# Patient Record
Sex: Female | Born: 1995 | Race: White | Hispanic: No | Marital: Married | Smoking: Never smoker
Health system: Southern US, Community
[De-identification: ages and names within clinical notes are randomized; demographics above are authoritative.]

## PROBLEM LIST (undated history)

## (undated) DIAGNOSIS — Z789 Other specified health status: Secondary | ICD-10-CM

## (undated) DIAGNOSIS — L309 Dermatitis, unspecified: Secondary | ICD-10-CM

## (undated) DIAGNOSIS — O24419 Gestational diabetes mellitus in pregnancy, unspecified control: Secondary | ICD-10-CM

## (undated) HISTORY — DX: Dermatitis, unspecified: L30.9

## (undated) HISTORY — DX: Gestational diabetes mellitus in pregnancy, unspecified control: O24.419

## (undated) HISTORY — PX: MOUTH SURGERY: SHX715

---
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
## 2017-05-26 ENCOUNTER — Encounter: Payer: Self-pay | Admitting: Advanced Practice Midwife

## 2017-06-09 ENCOUNTER — Encounter: Payer: Self-pay | Admitting: Advanced Practice Midwife

## 2017-06-09 ENCOUNTER — Ambulatory Visit (INDEPENDENT_AMBULATORY_CARE_PROVIDER_SITE_OTHER): Payer: 59 | Admitting: Advanced Practice Midwife

## 2017-06-09 VITALS — BP 108/56 | HR 84 | Ht 62.5 in | Wt 128.0 lb

## 2017-06-09 DIAGNOSIS — N898 Other specified noninflammatory disorders of vagina: Secondary | ICD-10-CM

## 2017-06-09 DIAGNOSIS — Z01419 Encounter for gynecological examination (general) (routine) without abnormal findings: Secondary | ICD-10-CM

## 2017-06-09 DIAGNOSIS — Z113 Encounter for screening for infections with a predominantly sexual mode of transmission: Secondary | ICD-10-CM

## 2017-06-09 NOTE — Progress Notes (Signed)
Joy Hicks 21 y.o.  Vitals:   06/09/17 0914  BP: (!) 108/56  Pulse: 84     Filed Weights   06/09/17 0914  Weight: 128 lb (58.1 kg)    Past Medical History: History reviewed. No pertinent past medical history.  Past Surgical History: Past Surgical History:  Procedure Laterality Date  . MOUTH SURGERY      Family History: Family History  Problem Relation Age of Onset  . Congestive Heart Failure Maternal Grandmother   . Diabetes Maternal Grandfather   . Hypertension Father   . High Cholesterol Father   . Hypertension Mother   . High Cholesterol Mother     Social History: Social History  Substance Use Topics  . Smoking status: Never Smoker  . Smokeless tobacco: Never Used  . Alcohol use No    Allergies:  Allergies  Allergen Reactions  . Nickel Rash      Current Outpatient Prescriptions:  .  Desoximetasone 0.05 % OINT, Apply topically as needed. , Disp: , Rfl:  .  JOLESSA 0.15-0.03 MG tablet, Take 1 tablet by mouth daily. , Disp: , Rfl:   History of Present Illness: here for physical. Has been on COCs for a few years.  Has been sexually active, same partner for a year. Declines bloodwork .  Saw MD about 6 monhs ago for entrance pain w/sex.  Found to have a "cut", used steroid cream for 2-3 weeks, got better, but quit using cream, now bothering her again. Uses lube.  Says "cut" is always there, doesn't come and go.     Review of Systems   Patient denies any headaches, blurred vision, shortness of breath, chest pain, abdominal pain, problems with bowel movements, urination   Physical Exam: General:  Well developed, well nourished, no acute distress Skin:  Warm and dry Neck:  Midline trachea, normal thyroid Lungs; Clear to auscultation bilaterally Breast:  No dominant palpable mass, retraction, or nipple discharge Cardiovascular: Regular rate and rhythm Abdomen:  Soft, non tender, no hepatosplenomegaly Pelvic:  External genitalia is normal in  appearance.Except for introitus which has a raw area to it.  Doesn't really look like HSV, but culture taken.   The vagina is normal in appearance.  The cervix is nulliparous.  Uterus is felt to be normal size, shape, and contour.  No adnexal masses or tenderness noted.  Extremities:  No swelling or varicosities noted Psych:  No mood changes.     Impression: normal gyn exam Vaginal ?fissure     Plan:  Use steroid cream for 3 weeks ,then back down to 1-2 times a week  pap >21 yo Continue COCs

## 2017-06-11 LAB — GC/CHLAMYDIA PROBE AMP
CHLAMYDIA, DNA PROBE: NEGATIVE
NEISSERIA GONORRHOEAE BY PCR: NEGATIVE

## 2017-06-12 LAB — HERPES SIMPLEX VIRUS CULTURE

## 2019-02-10 ENCOUNTER — Other Ambulatory Visit (HOSPITAL_COMMUNITY)
Admission: RE | Admit: 2019-02-10 | Discharge: 2019-02-10 | Disposition: A | Discharge status: Home / Self Care | Payer: 59 | Source: Ambulatory Visit | Attending: Family Medicine | Admitting: Family Medicine

## 2019-02-10 ENCOUNTER — Ambulatory Visit (INDEPENDENT_AMBULATORY_CARE_PROVIDER_SITE_OTHER): Payer: 59 | Admitting: Family Medicine

## 2019-02-10 ENCOUNTER — Other Ambulatory Visit: Payer: Self-pay

## 2019-02-10 ENCOUNTER — Encounter: Payer: Self-pay | Admitting: Family Medicine

## 2019-02-10 VITALS — BP 117/69 | HR 94 | Ht 62.0 in | Wt 146.0 lb

## 2019-02-10 DIAGNOSIS — N939 Abnormal uterine and vaginal bleeding, unspecified: Secondary | ICD-10-CM | POA: Insufficient documentation

## 2019-02-10 DIAGNOSIS — N926 Irregular menstruation, unspecified: Secondary | ICD-10-CM

## 2019-02-10 DIAGNOSIS — Z3169 Encounter for other general counseling and advice on procreation: Secondary | ICD-10-CM | POA: Diagnosis not present

## 2019-02-10 DIAGNOSIS — Z01419 Encounter for gynecological examination (general) (routine) without abnormal findings: Secondary | ICD-10-CM | POA: Diagnosis not present

## 2019-02-10 MED ORDER — PREPLUS 27-1 MG PO TABS: 1.0000 | ORAL_TABLET | Freq: Every day | ORAL | 13 refills | Status: DC

## 2019-02-10 NOTE — Progress Notes (Signed)
    GYNECOLOGY PROBLEM  VISIT ENCOUNTER NOTE  Subjective:   Joy Hicks is a 23 y.o. G0P0000 female here for a a problem GYN visit.    Chief Complaint  Patient presents with  . Vaginal Bleeding    irregular period    Vaginally spotting for Feb only. Reports regular period prior to that. Stopped OCP 10 months prior and she started to try for pregnancy 1 month ago with current partner.   Denies abnormal vaginal bleeding, discharge, pelvic pain, problems with intercourse or other gynecologic concerns.    Gynecologic History Patient's last menstrual period was 01/24/2019. Contraception: none  Health Maintenance Due  Topic Date Due  . CHLAMYDIA SCREENING  08/27/2011  . HIV Screening  08/27/2011  . TETANUS/TDAP  08/27/2015  . PAP-Cervical Cytology Screening  08/26/2017  . PAP SMEAR-Modifier  08/26/2017  . INFLUENZA VACCINE  07/22/2018     The following portions of the patient's history were reviewed and updated as appropriate: allergies, current medications, past family history, past medical history, past social history, past surgical history and problem list.  Review of Systems Pertinent items are noted in HPI.   Objective:  BP 117/69 (BP Location: Right Arm, Patient Position: Sitting, Cuff Size: Normal)   Pulse 94   Ht 5\' 2"  (1.575 m)   Wt 146 lb (66.2 kg)   LMP 01/24/2019   BMI 26.70 kg/m  Gen: well appearing, NAD HEENT: no scleral icterus CV: RR Lung: Normal WOB Ext: warm well perfused  PELVIC: Normal appearing external genitalia; normal appearing vaginal mucosa and cervix.  No abnormal discharge noted.  Pap smear obtained.  Normal uterine size, no other palpable masses, no uterine or adnexal tenderness.   Assessment and Plan:  1. Vaginal bleeding (93790) Only one month of irregularity. Recommended watchful waiting to see pattern Reviewed use of period tracking app Discussed need for testing if continued-specifically thyroid function.   2. Pre-conception  counseling Reviewed medication, basic fertility tracking, pregnancy safe medication Recommended PNV use Discussed timing of sex  Discussed home UPT and confirmation at office  4. Well woman-- never had pap, collected today Reviewed wellness needs and care gaps. Patient will have CT testing today Declines flu today and will discuss TDap at PCP office.  Reviewed importance of diet and exercise  Recommended 10% weight reduction to help with fertility  Please refer to After Visit Summary for other counseling recommendations.   Return if symptoms worsen or fail to improve.  Federico Flake, MD, MPH, ABFM Attending Physician Faculty Practice- [facility] for [facility]

## 2019-02-10 NOTE — Patient Instructions (Signed)
Recommend tracking your period-- use Apple app or Period Plus app Recommend tracking basal body temperature and cervical mucous     Preventive Care 18-39 Years, Female Preventive care refers to lifestyle choices and visits with your health care provider that can promote health and wellness. What does preventive care include?   A yearly physical exam. This is also called an annual well check.  Dental exams once or twice a year.  Routine eye exams. Ask your health care provider how often you should have your eyes checked.  Personal lifestyle choices, including: ? Daily care of your teeth and gums. ? Regular physical activity. ? Eating a healthy diet. ? Avoiding tobacco and drug use. ? Limiting alcohol use. ? Practicing safe sex. ? Taking vitamin and mineral supplements as recommended by your health care provider. What happens during an annual well check? The services and screenings done by your health care provider during your annual well check will depend on your age, overall health, lifestyle risk factors, and family history of disease. Counseling Your health care provider may ask you questions about your:  Alcohol use.  Tobacco use.  Drug use.  Emotional well-being.  Home and relationship well-being.  Sexual activity.  Eating habits.  Work and work Statistician.  Method of birth control.  Menstrual cycle.  Pregnancy history. Screening You may have the following tests or measurements:  Height, weight, and BMI.  Diabetes screening. This is done by checking your blood sugar (glucose) after you have not eaten for a while (fasting).  Blood pressure.  Lipid and cholesterol levels. These may be checked every 5 years starting at age 24.  Skin check.  Hepatitis C blood test.  Hepatitis B blood test.  Sexually transmitted disease (STD) testing.  BRCA-related cancer screening. This may be done if you have a family history of breast, ovarian, tubal, or  peritoneal cancers.  Pelvic exam and Pap test. This may be done every 3 years starting at age 78. Starting at age 73, this may be done every 5 years if you have a Pap test in combination with an HPV test. Discuss your test results, treatment options, and if necessary, the need for more tests with your health care provider. Vaccines Your health care provider may recommend certain vaccines, such as:  Influenza vaccine. This is recommended every year.  Tetanus, diphtheria, and acellular pertussis (Tdap, Td) vaccine. You may need a Td booster every 10 years.  Varicella vaccine. You may need this if you have not been vaccinated.  HPV vaccine. If you are 80 or younger, you may need three doses over 6 months.  Measles, mumps, and rubella (MMR) vaccine. You may need at least one dose of MMR. You may also need a second dose.  Pneumococcal 13-valent conjugate (PCV13) vaccine. You may need this if you have certain conditions and were not previously vaccinated.  Pneumococcal polysaccharide (PPSV23) vaccine. You may need one or two doses if you smoke cigarettes or if you have certain conditions.  Meningococcal vaccine. One dose is recommended if you are age 82-21 years and a first-year college student living in a residence hall, or if you have one of several medical conditions. You may also need additional booster doses.  Hepatitis A vaccine. You may need this if you have certain conditions or if you travel or work in places where you may be exposed to hepatitis A.  Hepatitis B vaccine. You may need this if you have certain conditions or if you travel or work  in places where you may be exposed to hepatitis B.  Haemophilus influenzae type b (Hib) vaccine. You may need this if you have certain risk factors. Talk to your health care provider about which screenings and vaccines you need and how often you need them. This information is not intended to replace advice given to you by your health care  provider. Make sure you discuss any questions you have with your health care provider. Document Released: 02/03/2002 Document Revised: 07/21/2017 Document Reviewed: 10/09/2015 Elsevier Interactive Patient Education  2019 [employer].

## 2019-02-11 LAB — CYTOLOGY - PAP
CHLAMYDIA, DNA PROBE: NEGATIVE
Diagnosis: NEGATIVE
Neisseria Gonorrhea: NEGATIVE

## 2019-04-11 ENCOUNTER — Ambulatory Visit (INDEPENDENT_AMBULATORY_CARE_PROVIDER_SITE_OTHER): Payer: 59 | Admitting: Adult Health

## 2019-04-11 ENCOUNTER — Encounter: Payer: Self-pay | Admitting: Adult Health

## 2019-04-11 ENCOUNTER — Other Ambulatory Visit: Payer: Self-pay

## 2019-04-11 VITALS — Ht 63.0 in | Wt 142.8 lb

## 2019-04-11 DIAGNOSIS — Z3201 Encounter for pregnancy test, result positive: Secondary | ICD-10-CM | POA: Diagnosis not present

## 2019-04-11 DIAGNOSIS — Z3A01 Less than 8 weeks gestation of pregnancy: Secondary | ICD-10-CM

## 2019-04-11 DIAGNOSIS — O3680X0 Pregnancy with inconclusive fetal viability, not applicable or unspecified: Secondary | ICD-10-CM

## 2019-04-11 NOTE — Progress Notes (Signed)
Patient ID: Joy Hicks, female   DOB: 07-Dec-1996, 23 y.o.   MRN: [id number]   TELEHEALTH VIRTUAL GYNECOLOGY VISIT ENCOUNTER NOTE  I connected with Bruce Donath on 04/11/19 at  8:30 AM EDT by telephone at home and verified that I am speaking with the correct person using two identifiers.   I discussed the limitations, risks, security and privacy concerns of performing an evaluation and management service by telephone and the availability of in person appointments. I also discussed with the patient that there may be a patient responsible charge related to this service. The patient expressed understanding and agreed to proceed.   History:  Joy Hicks is a 23 y.o. G1P0000, white  Female,married, being evaluated today for missed period and 5 +HPTs,she is about 5+2 weeks by LMP 03/05/2019, with EDD 12/128/2020. She works at [employer] in [place]  She denies any nausea or vomiting, but has spotted after sex and feels tired. Reviewd early symptoms of pregnancy with pt. She is aware babies delivered at [facility] and about after hours call service.Marland Kitchen    PCP is Elizabeth Palau FNP.    History reviewed. No pertinent past medical history. Past Surgical History:  Procedure Laterality Date  . MOUTH SURGERY     The following portions of the patient's history were reviewed and updated as appropriate: allergies, current medications, past family history, past medical history, past social history, past surgical history and problem list.   Health Maintenance:  Normal pap on 02/10/2019  Review of Systems:  Pertinent items noted in HPI and remainder of comprehensive ROS otherwise negative.  Physical Exam:   General:  Alert, oriented and cooperative.   Mental Status: Normal mood and affect perceived. Normal judgment and thought content.  Physical exam deferred due to nature of the encounter Ht 5\' 3"  (1.6 m)   Wt 142 lb 12.8 oz (64.8 kg)   LMP 03/05/2019 (Approximate)   BMI 25.30 kg/m per pt.   Fall risk is low. Labs and Imaging No results found for this or any previous visit (from the past 336 hour(s)). No results found.    Assessment and Plan:     1. Positive pregnancy test -5+HPTs  2. Less than [redacted] weeks gestation of pregnancy -continue PNV -eat often  3. Encounter to determine fetal viability of pregnancy, single or unspecified fetus - US OB Comp Less 14 Wks; Future, in about 3 weeks        I discussed the assessment and treatment plan with the patient. The patient was provided an opportunity to ask questions and all were answered. The patient agreed with the plan and demonstrated an understanding of the instructions.   The patient was advised to call back or seek an in-person evaluation/go to the ED if the symptoms worsen or if the condition fails to improve as anticipated.  I provided 6 minutes of non-face-to-face time during this encounter.   Cyril Mourning, NP Center for [employer] [facility], [facility]

## 2019-04-15 ENCOUNTER — Telehealth: Payer: Self-pay | Admitting: Obstetrics & Gynecology

## 2019-04-15 NOTE — Telephone Encounter (Signed)
Today [redacted] weeks pregnant and had spotting after sex, on Saturday, still spotting, dark pink last night was brown before, now brown again, try to not strain and no sex, some cramping.. push fluids and try prunes.

## 2019-04-15 NOTE — Telephone Encounter (Signed)
Spoke with pt. Pt is [redacted] weeks pregnant. Pt had visit Monday. Pt had sex Saturday. Noticed blood after sex. We advised no more sex x 7 days from the last time she wipes any color. Pt is still noticing a dark brown discharge. Last night it turned pink. Only when wiping. Not enough to wear a pad. Has not noticed any more pink. + constipated. + cramps. Last BM was 2-3 days ago. Advised can eat 4 prunes every day or drink prune juice everyday. Pt is concerned with spotting that has continued for several days. Please advise. Thanks! JSY

## 2019-04-15 NOTE — Telephone Encounter (Signed)
Please call pt she is having some bleeding she is concerned about

## 2019-05-02 ENCOUNTER — Ambulatory Visit (INDEPENDENT_AMBULATORY_CARE_PROVIDER_SITE_OTHER): Payer: 59

## 2019-05-02 ENCOUNTER — Other Ambulatory Visit: Payer: Self-pay

## 2019-05-02 DIAGNOSIS — Z3A08 8 weeks gestation of pregnancy: Secondary | ICD-10-CM

## 2019-05-02 DIAGNOSIS — O3680X0 Pregnancy with inconclusive fetal viability, not applicable or unspecified: Secondary | ICD-10-CM

## 2019-05-02 NOTE — Progress Notes (Signed)
[place] 7+1 wks,single IUP w/ys,positive fht 137 bpm,normal ovaries bilat,crl 10.26 mm

## 2019-06-08 ENCOUNTER — Other Ambulatory Visit: Payer: Self-pay | Admitting: Obstetrics and Gynecology

## 2019-06-08 DIAGNOSIS — Z3682 Encounter for antenatal screening for nuchal translucency: Secondary | ICD-10-CM

## 2019-06-09 ENCOUNTER — Other Ambulatory Visit: Payer: Self-pay

## 2019-06-09 ENCOUNTER — Ambulatory Visit: Payer: 59 | Admitting: *Deleted

## 2019-06-09 ENCOUNTER — Encounter: Payer: 59 | Admitting: Advanced Practice Midwife

## 2019-06-09 ENCOUNTER — Other Ambulatory Visit: Payer: 59

## 2019-06-09 ENCOUNTER — Ambulatory Visit (INDEPENDENT_AMBULATORY_CARE_PROVIDER_SITE_OTHER): Payer: 59

## 2019-06-09 DIAGNOSIS — Z3682 Encounter for antenatal screening for nuchal translucency: Secondary | ICD-10-CM | POA: Diagnosis not present

## 2019-06-09 DIAGNOSIS — Z3A12 12 weeks gestation of pregnancy: Secondary | ICD-10-CM

## 2019-06-09 NOTE — Progress Notes (Signed)
Pt here for [place] and 1st IT. JSY

## 2019-06-09 NOTE — Progress Notes (Signed)
[place] 12+4 wks,measurements c/w dates,crl 58.48 mm,NB present,NT 1.3 mm,fhr 150 bpm,normal ovaries bilat

## 2019-06-11 LAB — INTEGRATED 1
Crown Rump Length: 58.5 mm
Gest. Age on Collection Date: 12.1 weeks
Maternal Age at EDD: 23.3 yr
Nuchal Translucency (NT): 1.3 mm
Number of Fetuses: 1
PAPP-A Value: 3626.9 ng/mL
Weight: 141 [lb_av]

## 2019-06-21 ENCOUNTER — Encounter: Payer: 59 | Admitting: Women's Health

## 2019-06-21 ENCOUNTER — Ambulatory Visit: Payer: 59 | Admitting: *Deleted

## 2019-07-05 ENCOUNTER — Other Ambulatory Visit: Payer: Self-pay

## 2019-07-05 ENCOUNTER — Encounter: Payer: Self-pay | Admitting: Women's Health

## 2019-07-05 ENCOUNTER — Ambulatory Visit: Payer: 59 | Admitting: *Deleted

## 2019-07-05 ENCOUNTER — Ambulatory Visit (INDEPENDENT_AMBULATORY_CARE_PROVIDER_SITE_OTHER): Payer: 59 | Admitting: Women's Health

## 2019-07-05 VITALS — BP 119/68 | HR 78 | Wt 142.0 lb

## 2019-07-05 DIAGNOSIS — Z1389 Encounter for screening for other disorder: Secondary | ICD-10-CM

## 2019-07-05 DIAGNOSIS — Z3402 Encounter for supervision of normal first pregnancy, second trimester: Secondary | ICD-10-CM

## 2019-07-05 DIAGNOSIS — Z1379 Encounter for other screening for genetic and chromosomal anomalies: Secondary | ICD-10-CM

## 2019-07-05 DIAGNOSIS — Z3A16 16 weeks gestation of pregnancy: Secondary | ICD-10-CM

## 2019-07-05 DIAGNOSIS — Z331 Pregnant state, incidental: Secondary | ICD-10-CM

## 2019-07-05 DIAGNOSIS — Z363 Encounter for antenatal screening for malformations: Secondary | ICD-10-CM

## 2019-07-05 DIAGNOSIS — O0990 Supervision of high risk pregnancy, unspecified, unspecified trimester: Secondary | ICD-10-CM | POA: Insufficient documentation

## 2019-07-05 LAB — POCT URINALYSIS DIPSTICK OB
Blood, UA: NEGATIVE
Glucose, UA: NEGATIVE
Ketones, UA: NEGATIVE
Leukocytes, UA: NEGATIVE
Nitrite, UA: NEGATIVE
POC,PROTEIN,UA: NEGATIVE

## 2019-07-05 MED ORDER — BLOOD PRESSURE MONITOR MISC: 0 refills | Status: DC

## 2019-07-05 NOTE — Patient Instructions (Signed)
Joy Hicks, I greatly value your feedback.  If you receive a survey following your visit with us today, we appreciate you taking the time to fill it out.  Thanks, Joellyn HaffKim Duron Meister, CNM, [facility]  [facility] is now [facility] ([address] [place], [place] [address]) Entrance located off of [address] Free 24/7 valet parking   Nausea & Vomiting  Have saltine crackers or pretzels by your bed and eat a few bites before you raise your head out of bed in the morning  Eat small frequent meals throughout the day instead of large meals  Drink plenty of fluids throughout the day to stay hydrated, just don't drink a lot of fluids with your meals.  This can make your stomach fill up faster making you feel sick  Do not brush your teeth right after you eat  Products with real ginger are good for nausea, like ginger ale and ginger hard candy Make sure it says made with real ginger!  Sucking on sour candy like lemon heads is also good for nausea  If your prenatal vitamins make you nauseated, take them at night so you will sleep through the nausea  Sea Bands  If you feel like you need medicine for the nausea & vomiting please let us know  If you are unable to keep any fluids or food down please let us know   Constipation  Drink plenty of fluid, preferably water, throughout the day  Eat foods high in fiber such as fruits, vegetables, and grains  Exercise, such as walking, is a good way to keep your bowels regular  Drink warm fluids, especially warm prune juice, or decaf coffee  Eat a 1/2 cup of real oatmeal (not instant), 1/2 cup applesauce, and 1/2-1 cup warm prune juice every day  If needed, you may take Colace (docusate sodium) stool softener once or twice a day to help keep the stool soft.   If you still are having problems with constipation, you may take Miralax once daily as needed to help keep your bowels regular.   Home Blood  Pressure Monitoring for Patients   Your provider has recommended that you check your blood pressure (BP) at least once a week at home. If you do not have a blood pressure cuff at home, one will be provided for you. Contact your provider if you have not received your monitor within 1 week.   Helpful Tips for Accurate Home Blood Pressure Checks  . Don't smoke, exercise, or drink caffeine 30 minutes before checking your BP . Use the restroom before checking your BP (a full bladder can raise your pressure) . Relax in a comfortable upright chair . Feet on the ground . Left arm resting comfortably on a flat surface at the level of your heart . Legs uncrossed . Back supported . Sit quietly and don't talk . Place the cuff on your bare arm . Adjust snuggly, so that only two fingertips can fit between your skin and the top of the cuff . Check 2 readings separated by at least one minute . Keep a log of your BP readings . For a visual, please reference this diagram: http://ccnc.care/bpdiagram  Provider Name: [facility]     Phone: [phone]  Zone 1: ALL CLEAR  Continue to monitor your symptoms:  . BP reading is less than 140 (top number) or less than 90 (bottom number)  . No right upper stomach  pain . No headaches or seeing spots . No feeling nauseated or throwing up . No swelling in face and hands  Zone 2: CAUTION Call your doctor's office for any of the following:  . BP reading is greater than 140 (top number) or greater than 90 (bottom number)  . Stomach pain under your ribs in the middle or right side . Headaches or seeing spots . Feeling nauseated or throwing up . Swelling in face and hands  Zone 3: EMERGENCY  Seek immediate medical care if you have any of the following:  . BP reading is greater than160 (top number) or greater than 110 (bottom number) . Severe headaches not improving with Tylenol . Serious difficulty catching your breath . Any worsening symptoms from Zone  2     Second Trimester of Pregnancy The second trimester is from week 14 through week 27 (months 4 through 6). The second trimester is often a time when you feel your best. Your body has adjusted to being pregnant, and you begin to feel better physically. Usually, morning sickness has lessened or quit completely, you may have more energy, and you may have an increase in appetite. The second trimester is also a time when the fetus is growing rapidly. At the end of the sixth month, the fetus is about 9 inches long and weighs about 1 pounds. You will likely begin to feel the baby move (quickening) between 16 and 20 weeks of pregnancy. Body changes during your second trimester Your body continues to go through many changes during your second trimester. The changes vary from woman to woman.  Your weight will continue to increase. You will notice your lower abdomen bulging out.  You may begin to get stretch marks on your hips, abdomen, and breasts.  You may develop headaches that can be relieved by medicines. The medicines should be approved by your health care provider.  You may urinate more often because the fetus is pressing on your bladder.  You may develop or continue to have heartburn as a result of your pregnancy.  You may develop constipation because certain hormones are causing the muscles that push waste through your intestines to slow down.  You may develop hemorrhoids or swollen, bulging veins (varicose veins).  You may have back pain. This is caused by: ? Weight gain. ? Pregnancy hormones that are relaxing the joints in your pelvis. ? A shift in weight and the muscles that support your balance.  Your breasts will continue to grow and they will continue to become tender.  Your gums may bleed and may be sensitive to brushing and flossing.  Dark spots or blotches (chloasma, mask of pregnancy) may develop on your face. This will likely fade after the baby is born.  A dark line  from your belly button to the pubic area (linea nigra) may appear. This will likely fade after the baby is born.  You may have changes in your hair. These can include thickening of your hair, rapid growth, and changes in texture. Some women also have hair loss during or after pregnancy, or hair that feels dry or thin. Your hair will most likely return to normal after your baby is born. What to expect at prenatal visits During a routine prenatal visit:  You will be weighed to make sure you and the fetus are growing normally.  Your blood pressure will be taken.  Your abdomen will be measured to track your baby's growth.  The fetal heartbeat will be listened  to.  Any test results from the previous visit will be discussed. Your health care provider may ask you:  How you are feeling.  If you are feeling the baby move.  If you have had any abnormal symptoms, such as leaking fluid, bleeding, severe headaches, or abdominal cramping.  If you are using any tobacco products, including cigarettes, chewing tobacco, and electronic cigarettes.  If you have any questions. Other tests that may be performed during your second trimester include:  Blood tests that check for: ? Low iron levels (anemia). ? High blood sugar that affects pregnant women (gestational diabetes) between 66 and 28 weeks. ? Rh antibodies. This is to check for a protein on red blood cells (Rh factor).  Urine tests to check for infections, diabetes, or protein in the urine.  An ultrasound to confirm the proper growth and development of the baby.  An amniocentesis to check for possible genetic problems.  Fetal screens for spina bifida and Down syndrome.  HIV (human immunodeficiency virus) testing. Routine prenatal testing includes screening for HIV, unless you choose not to have this test. Follow these instructions at home: Medicines  Follow your health care provider's instructions regarding medicine use. Specific  medicines may be either safe or unsafe to take during pregnancy.  Take a prenatal vitamin that contains at least 600 micrograms (mcg) of folic acid.  If you develop constipation, try taking a stool softener if your health care provider approves. Eating and drinking   Eat a balanced diet that includes fresh fruits and vegetables, whole grains, good sources of protein such as meat, eggs, or tofu, and low-fat dairy. Your health care provider will help you determine the amount of weight gain that is right for you.  Avoid raw meat and uncooked cheese. These carry germs that can cause birth defects in the baby.  If you have low calcium intake from food, talk to your health care provider about whether you should take a daily calcium supplement.  Limit foods that are high in fat and processed sugars, such as fried and sweet foods.  To prevent constipation: ? Drink enough fluid to keep your urine clear or pale yellow. ? Eat foods that are high in fiber, such as fresh fruits and vegetables, whole grains, and beans. Activity  Exercise only as directed by your health care provider. Most women can continue their usual exercise routine during pregnancy. Try to exercise for 30 minutes at least 5 days a week. Stop exercising if you experience uterine contractions.  Avoid heavy lifting, wear low heel shoes, and practice good posture.  A sexual relationship may be continued unless your health care provider directs you otherwise. Relieving pain and discomfort  Wear a good support bra to prevent discomfort from breast tenderness.  Take warm sitz baths to soothe any pain or discomfort caused by hemorrhoids. Use hemorrhoid cream if your health care provider approves.  Rest with your legs elevated if you have leg cramps or low back pain.  If you develop varicose veins, wear support hose. Elevate your feet for 15 minutes, 3-4 times a day. Limit salt in your diet. Prenatal Care  Write down your  questions. Take them to your prenatal visits.  Keep all your prenatal visits as told by your health care provider. This is important. Safety  Wear your seat belt at all times when driving.  Make a list of emergency phone numbers, including numbers for family, friends, the hospital, and police and fire departments. General instructions  Ask  your health care provider for a referral to a local prenatal education class. Begin classes no later than the beginning of month 6 of your pregnancy.  Ask for help if you have counseling or nutritional needs during pregnancy. Your health care provider can offer advice or refer you to specialists for help with various needs.  Do not use hot tubs, steam rooms, or saunas.  Do not douche or use tampons or scented sanitary pads.  Do not cross your legs for long periods of time.  Avoid cat litter boxes and soil used by cats. These carry germs that can cause birth defects in the baby and possibly loss of the fetus by miscarriage or stillbirth.  Avoid all smoking, herbs, alcohol, and unprescribed drugs. Chemicals in these products can affect the formation and growth of the baby.  Do not use any products that contain nicotine or tobacco, such as cigarettes and e-cigarettes. If you need help quitting, ask your health care provider.  Visit your dentist if you have not gone yet during your pregnancy. Use a soft toothbrush to brush your teeth and be gentle when you floss. Contact a health care provider if:  You have dizziness.  You have mild pelvic cramps, pelvic pressure, or nagging pain in the abdominal area.  You have persistent nausea, vomiting, or diarrhea.  You have a bad smelling vaginal discharge.  You have pain when you urinate. Get help right away if:  You have a fever.  You are leaking fluid from your vagina.  You have spotting or bleeding from your vagina.  You have severe abdominal cramping or pain.  You have rapid weight gain or  weight loss.  You have shortness of breath with chest pain.  You notice sudden or extreme swelling of your face, hands, ankles, feet, or legs.  You have not felt your baby move in over an hour.  You have severe headaches that do not go away when you take medicine.  You have vision changes. Summary  The second trimester is from week 14 through week 27 (months 4 through 6). It is also a time when the fetus is growing rapidly.  Your body goes through many changes during pregnancy. The changes vary from woman to woman.  Avoid all smoking, herbs, alcohol, and unprescribed drugs. These chemicals affect the formation and growth your baby.  Do not use any tobacco products, such as cigarettes, chewing tobacco, and e-cigarettes. If you need help quitting, ask your health care provider.  Contact your health care provider if you have any questions. Keep all prenatal visits as told by your health care provider. This is important. This information is not intended to replace advice given to you by your health care provider. Make sure you discuss any questions you have with your health care provider. Document Released: 12/02/2001 Document Revised: 04/01/2019 Document Reviewed: 01/13/2017 Elsevier Patient Education  2020 Elsevier Inc.  Coronavirus (COVID-19) Are you at risk?  Are you at risk for the Coronavirus (COVID-19)?  To be considered HIGH RISK for Coronavirus (COVID-19), you have to meet the following criteria:  . Traveled to [place], [place], [place], [place] or [place]; or in the [place] to [place], [place], [place], or [place]; and have fever, cough, and shortness of breath within the last 2 weeks of travel OR . Been in close contact with a person diagnosed with COVID-19 within the last 2 weeks and have fever, cough, and shortness of breath . IF YOU DO NOT MEET  THESE CRITERIA, YOU ARE CONSIDERED LOW RISK FOR COVID-19.  What to do if you are HIGH RISK for COVID-19?  Marland Kitchen If  you are having a medical emergency, call 911. . Seek medical care right away. Before you go to a doctor's office, urgent care or emergency department, call ahead and tell them about your recent travel, contact with someone diagnosed with COVID-19, and your symptoms. You should receive instructions from your physician's office regarding next steps of care.  . When you arrive at healthcare provider, tell the healthcare staff immediately you have returned from visiting [place], [place], [place], [place] or [place]; or traveled in the [place] to [place], [place], [place], or [place]; in the last two weeks or you have been in close contact with a person diagnosed with COVID-19 in the last 2 weeks.   . Tell the health care staff about your symptoms: fever, cough and shortness of breath. . After you have been seen by a medical provider, you will be either: o Tested for (COVID-19) and discharged home on quarantine except to seek medical care if symptoms worsen, and asked to  - Stay home and avoid contact with others until you get your results (4-5 days)  - Avoid travel on public transportation if possible (such as bus, train, or airplane) or o Sent to the Emergency Department by EMS for evaluation, COVID-19 testing, and possible admission depending on your condition and test results.  What to do if you are LOW RISK for COVID-19?  Reduce your risk of any infection by using the same precautions used for avoiding the common cold or flu:  Marland Kitchen Wash your hands often with soap and warm water for at least 20 seconds.  If soap and water are not readily available, use an alcohol-based hand sanitizer with at least 60% alcohol.  . If coughing or sneezing, cover your mouth and nose by coughing or sneezing into the elbow areas of your shirt or coat, into a tissue or into your sleeve (not your hands). . Avoid shaking hands with others and consider head nods or verbal greetings only. . Avoid touching your eyes,  nose, or mouth with unwashed hands.  . Avoid close contact with people who are sick. . Avoid places or events with large numbers of people in one location, like concerts or sporting events. . Carefully consider travel plans you have or are making. . If you are planning any travel outside or inside the [place], visit the CDC's Travelers' Health webpage for the latest health notices. . If you have some symptoms but not all symptoms, continue to monitor at home and seek medical attention if your symptoms worsen. . If you are having a medical emergency, call 911.   [place] / e-Visit: [email or web address]         [facility]: [facility]: [phone]                   [facility]: [phone]

## 2019-07-05 NOTE — Progress Notes (Signed)
INITIAL OBSTETRICAL VISIT Patient name: Joy Hicks MRN [id number]  Date of birth: Jun 05, 1996 Chief Complaint:   Initial Prenatal Visit  History of Present Illness:   Joy Hicks is a 23 y.o. G23P0000 Caucasian female at 108w2d by 7wk u/s, with an Estimated Date of Delivery: 12/18/19 being seen today for her initial obstetrical visit.   Her obstetrical history is significant for primigravida.   Today she reports no complaints.  Patient's last menstrual period was 03/05/2019 (exact date). Last pap 02/10/19. Results were: normal Review of Systems:   Pertinent items are noted in HPI Denies cramping/contractions, leakage of fluid, vaginal bleeding, abnormal vaginal discharge w/ itching/odor/irritation, headaches, visual changes, shortness of breath, chest pain, abdominal pain, severe nausea/vomiting, or problems with urination or bowel movements unless otherwise stated above.  Pertinent History Reviewed:  Reviewed past medical,surgical, social, obstetrical and family history.  Reviewed problem list, medications and allergies. OB History  Gravida Para Term Preterm AB Living  1 0 0 0 0 0  SAB TAB Ectopic Multiple Live Births  0 0 0 0 0    # Outcome Date GA Lbr Len/2nd Weight Sex Delivery Anes PTL Lv  1 Current            Physical Assessment:   Vitals:   07/05/19 1119  BP: 119/68  Pulse: 78  Weight: 142 lb (64.4 kg)  Body mass index is 25.15 kg/m.       Physical Examination:  General appearance - well appearing, and in no distress  Mental status - alert, oriented to person, place, and time  Psych:  She has a normal mood and affect  Skin - warm and dry, normal color, no suspicious lesions noted  Chest - effort normal, all lung fields clear to auscultation bilaterally  Heart - normal rate and regular rhythm  Abdomen - soft, nontender  Extremities:  No swelling or varicosities noted  Thin prep pap is not done  FHT: 158  Results for orders placed or performed in visit on  07/05/19 (from the past 24 hour(s))  POC Urinalysis Dipstick OB   Collection Time: 07/05/19 11:31 AM  Result Value Ref Range   Color, UA     Clarity, UA     Glucose, UA Negative Negative   Bilirubin, UA     Ketones, UA neg    Spec Grav, UA     Blood, UA neg    pH, UA     POC,PROTEIN,UA Negative Negative, Trace, Small (1+), Moderate (2+), Large (3+), 4+   Urobilinogen, UA     Nitrite, UA neg    Leukocytes, UA Negative Negative   Appearance     Odor      Assessment & Plan:  1) Low-Risk Pregnancy G1P0000 at [redacted]w[redacted]d with an Estimated Date of Delivery: 12/18/19   2) Initial OB visit  Meds:  Meds ordered this encounter  Medications  . Blood Pressure Monitor MISC    Sig: For regular home bp monitoring during pregnancy    Dispense:  1 each    Refill:  0    Dx: z34.90    Order Specific Question:   Supervising Provider    Answer:   Florian Buff [[id number]]    Initial labs obtained Continue prenatal vitamins Reviewed n/v relief measures and warning s/s to report Reviewed recommended weight gain based on pre-gravid BMI Encouraged well-balanced diet Genetic Screening discussed: requested nt/it, 2nd IT today, declined maternit21 Cystic fibrosis, SMA, Fragile X screening discussed declined Ultrasound discussed;  fetal survey: requested CCNC completed>not applying for preg mcaid Does not have home bp cuff. Rx faxed to [place]. Check bp weekly, let us know if >140/90.   Follow-up: Return in about 2 weeks (around 07/19/2019) for LROB, 2nd IT, ZO:XWRUEAVS:Anatomy.   Orders Placed This Encounter  Procedures  . GC/Chlamydia Probe Amp  . Urine Culture  . US OB Comp + 14 Wk  . INTEGRATED 2  . Obstetric Panel, Including HIV  . Sickle cell screen  . Urinalysis, Routine w reflex microscopic  . Pain Management Screening Profile (10S)  . POC Urinalysis Dipstick OB    Cheral MarkerKimberly R Omid Deardorff CNM, [facility] 07/05/2019 12:45 PM

## 2019-07-06 LAB — PMP SCREEN PROFILE (10S), URINE
Amphetamine Scrn, Ur: NEGATIVE ng/mL
BARBITURATE SCREEN URINE: NEGATIVE ng/mL
BENZODIAZEPINE SCREEN, URINE: NEGATIVE ng/mL
CANNABINOIDS UR QL SCN: NEGATIVE ng/mL
Cocaine (Metab) Scrn, Ur: NEGATIVE ng/mL
Creatinine(Crt), U: 38.1 mg/dL (ref 20.0–300.0)
Methadone Screen, Urine: NEGATIVE ng/mL
OXYCODONE+OXYMORPHONE UR QL SCN: NEGATIVE ng/mL
Opiate Scrn, Ur: NEGATIVE ng/mL
Ph of Urine: 6.6 (ref 4.5–8.9)
Phencyclidine Qn, Ur: NEGATIVE ng/mL
Propoxyphene Scrn, Ur: NEGATIVE ng/mL

## 2019-07-07 LAB — INTEGRATED 2
AFP MoM: 2.02
Alpha-Fetoprotein: 63.3 ng/mL
Crown Rump Length: 58.5 mm
DIA MoM: 0.76
DIA Value: 133.4 pg/mL
Estriol, Unconjugated: 1.27 ng/mL
Gest. Age on Collection Date: 12.1 weeks
Gestational Age: 15.9 weeks
Maternal Age at EDD: 23.3 yr
Nuchal Translucency (NT): 1.3 mm
Nuchal Translucency MoM: 1
Number of Fetuses: 1
PAPP-A MoM: 3.65
PAPP-A Value: 3626.9 ng/mL
Test Results:: NEGATIVE
Weight: 141 [lb_av]
Weight: 141 [lb_av]
hCG MoM: 0.7
hCG Value: 28.2 IU/mL
uE3 MoM: 1.48

## 2019-07-07 LAB — URINALYSIS, ROUTINE W REFLEX MICROSCOPIC
Bilirubin, UA: NEGATIVE
Glucose, UA: NEGATIVE
Ketones, UA: NEGATIVE
Leukocytes,UA: NEGATIVE
Nitrite, UA: NEGATIVE
Protein,UA: NEGATIVE
RBC, UA: NEGATIVE
Specific Gravity, UA: 1.008 (ref 1.005–1.030)
Urobilinogen, Ur: 0.2 mg/dL (ref 0.2–1.0)
pH, UA: 7 (ref 5.0–7.5)

## 2019-07-07 LAB — OBSTETRIC PANEL, INCLUDING HIV
Antibody Screen: NEGATIVE
Basophils Absolute: 0 10*3/uL (ref 0.0–0.2)
Basos: 1 %
EOS (ABSOLUTE): 0.1 10*3/uL (ref 0.0–0.4)
Eos: 2 %
HIV Screen 4th Generation wRfx: NONREACTIVE
Hematocrit: 34 % (ref 34.0–46.6)
Hemoglobin: 11.9 g/dL (ref 11.1–15.9)
Hepatitis B Surface Ag: NEGATIVE
Immature Grans (Abs): 0 10*3/uL (ref 0.0–0.1)
Immature Granulocytes: 1 %
Lymphocytes Absolute: 1.6 10*3/uL (ref 0.7–3.1)
Lymphs: 25 %
MCH: 30.5 pg (ref 26.6–33.0)
MCHC: 35 g/dL (ref 31.5–35.7)
MCV: 87 fL (ref 79–97)
Monocytes Absolute: 0.5 10*3/uL (ref 0.1–0.9)
Monocytes: 7 %
Neutrophils Absolute: 4.2 10*3/uL (ref 1.4–7.0)
Neutrophils: 64 %
Platelets: 160 10*3/uL (ref 150–450)
RBC: 3.9 x10E6/uL (ref 3.77–5.28)
RDW: 14.2 % (ref 11.7–15.4)
RPR Ser Ql: NONREACTIVE
Rh Factor: POSITIVE
Rubella Antibodies, IGG: 1.6 index (ref >0.99)
WBC: 6.4 10*3/uL (ref 3.4–10.8)

## 2019-07-07 LAB — URINE CULTURE: Organism ID, Bacteria: NO GROWTH

## 2019-07-07 LAB — SICKLE CELL SCREEN: Sickle Cell Screen: NEGATIVE

## 2019-07-10 LAB — GC/CHLAMYDIA PROBE AMP
Chlamydia trachomatis, NAA: NEGATIVE
Neisseria Gonorrhoeae by PCR: NEGATIVE

## 2019-07-11 ENCOUNTER — Telehealth: Payer: Self-pay | Admitting: Women's Health

## 2019-07-11 ENCOUNTER — Telehealth: Payer: Self-pay | Admitting: *Deleted

## 2019-07-11 NOTE — Telephone Encounter (Signed)
Please call pt has questions about getting a bp cuff

## 2019-07-11 NOTE — Telephone Encounter (Signed)
Patient informed I have sent a request for a BP cuff through [employer].

## 2019-07-26 ENCOUNTER — Other Ambulatory Visit: Payer: Self-pay

## 2019-07-26 ENCOUNTER — Encounter: Payer: Self-pay | Admitting: Women's Health

## 2019-07-26 ENCOUNTER — Other Ambulatory Visit: Payer: Self-pay | Admitting: Women's Health

## 2019-07-26 ENCOUNTER — Ambulatory Visit (INDEPENDENT_AMBULATORY_CARE_PROVIDER_SITE_OTHER): Payer: 59

## 2019-07-26 ENCOUNTER — Ambulatory Visit (INDEPENDENT_AMBULATORY_CARE_PROVIDER_SITE_OTHER): Payer: 59 | Admitting: Obstetrics & Gynecology

## 2019-07-26 ENCOUNTER — Encounter: Payer: Self-pay | Admitting: Obstetrics & Gynecology

## 2019-07-26 VITALS — BP 120/66 | HR 71 | Wt 142.0 lb

## 2019-07-26 DIAGNOSIS — O4402 Placenta previa specified as without hemorrhage, second trimester: Secondary | ICD-10-CM

## 2019-07-26 DIAGNOSIS — Z363 Encounter for antenatal screening for malformations: Secondary | ICD-10-CM

## 2019-07-26 DIAGNOSIS — Z3402 Encounter for supervision of normal first pregnancy, second trimester: Secondary | ICD-10-CM

## 2019-07-26 DIAGNOSIS — Z1389 Encounter for screening for other disorder: Secondary | ICD-10-CM

## 2019-07-26 DIAGNOSIS — Z3A19 19 weeks gestation of pregnancy: Secondary | ICD-10-CM

## 2019-07-26 DIAGNOSIS — O4400 Placenta previa specified as without hemorrhage, unspecified trimester: Secondary | ICD-10-CM | POA: Insufficient documentation

## 2019-07-26 DIAGNOSIS — Z331 Pregnant state, incidental: Secondary | ICD-10-CM

## 2019-07-26 DIAGNOSIS — O0992 Supervision of high risk pregnancy, unspecified, second trimester: Secondary | ICD-10-CM

## 2019-07-26 LAB — POCT URINALYSIS DIPSTICK OB
Blood, UA: NEGATIVE
Glucose, UA: NEGATIVE
Ketones, UA: NEGATIVE
Leukocytes, UA: NEGATIVE
Nitrite, UA: NEGATIVE
POC,PROTEIN,UA: NEGATIVE

## 2019-07-26 NOTE — Progress Notes (Signed)
[place] TA/TV 27+7 wks,cephalic,cx 3.3 cm,complete posterior placenta previa,SVP of fluid 4.2 cm,normal ovaries bilat,fhr 144 bpm,efw 269 g 30%,anatomy complete

## 2019-07-26 NOTE — Progress Notes (Signed)
   HIGH-RISK PREGNANCY VISIT Patient name: Joy Hicks MRN [id number]  Date of birth: 05-10-1996 Chief Complaint:   Routine Prenatal Visit  History of Present Illness:   Joy Hicks is a 23 y.o. G86P0000 female at [redacted]w[redacted]d with an Estimated Date of Delivery: 12/18/19 being seen today for ongoing management of a high-risk pregnancy complicated by complete previa Today she reports no complaints. Contractions: Not present. Vag. Bleeding: None.  Movement: Present. denies leaking of fluid.  Review of Systems:   Pertinent items are noted in HPI Denies abnormal vaginal discharge w/ itching/odor/irritation, headaches, visual changes, shortness of breath, chest pain, abdominal pain, severe nausea/vomiting, or problems with urination or bowel movements unless otherwise stated above. Pertinent History Reviewed:  Reviewed past medical,surgical, social, obstetrical and family history.  Reviewed problem list, medications and allergies. Physical Assessment:   Vitals:   07/26/19 1124  BP: 120/66  Pulse: 71  Weight: 142 lb (64.4 kg)  Body mass index is 25.15 kg/m.           Physical Examination:   General appearance: alert, well appearing, and in no distress  Mental status: alert, oriented to person, place, and time  Skin: warm & dry   Extremities: Edema: None    Cardiovascular: normal heart rate noted  Respiratory: normal respiratory effort, no distress  Abdomen: gravid, soft, non-tender  Pelvic: Cervical exam deferred         Fetal Status: Fetal Heart Rate (bpm): 154   Movement: Present    Fetal Surveillance Testing today: sonogram   Results for orders placed or performed in visit on 07/26/19 (from the past 24 hour(s))  POC Urinalysis Dipstick OB   Collection Time: 07/26/19 11:14 AM  Result Value Ref Range   Color, UA     Clarity, UA     Glucose, UA Negative Negative   Bilirubin, UA     Ketones, UA neg    Spec Grav, UA     Blood, UA neg    pH, UA     POC,PROTEIN,UA Negative  Negative, Trace, Small (1+), Moderate (2+), Large (3+), 4+   Urobilinogen, UA     Nitrite, UA neg    Leukocytes, UA Negative Negative   Appearance     Odor      Assessment & Plan:  1) High-risk pregnancy G1P0000 at [redacted]w[redacted]d with an Estimated Date of Delivery: 12/18/19   2) Complete placental previa, stable, no bleeding   Meds: No orders of the defined types were placed in this encounter.   Labs/procedures today: sonogram  Treatment Plan:  Repeat scan at 28 weeks, pelvic rest, if has menstrual type bleeding [facility]  Reviewed: Preterm labor symptoms and general obstetric precautions including but not limited to vaginal bleeding, contractions, leaking of fluid and fetal movement were reviewed in detail with the patient.  All questions were answered.  home bp cuff. Rx faxed to . Check bp weekly, let us know if >140/90.   Follow-up: Return in about 4 weeks (around 08/23/2019) for [place].  Orders Placed This Encounter  Procedures  . POC Urinalysis Dipstick OB   Florian Buff  07/26/2019 11:38 AM

## 2019-08-23 ENCOUNTER — Other Ambulatory Visit: Payer: Self-pay

## 2019-08-23 ENCOUNTER — Ambulatory Visit (INDEPENDENT_AMBULATORY_CARE_PROVIDER_SITE_OTHER): Payer: 59 | Admitting: Obstetrics & Gynecology

## 2019-08-23 ENCOUNTER — Encounter: Payer: Self-pay | Admitting: Obstetrics & Gynecology

## 2019-08-23 VITALS — BP 120/67 | HR 85 | Wt 147.5 lb

## 2019-08-23 DIAGNOSIS — Z331 Pregnant state, incidental: Secondary | ICD-10-CM

## 2019-08-23 DIAGNOSIS — O0990 Supervision of high risk pregnancy, unspecified, unspecified trimester: Secondary | ICD-10-CM

## 2019-08-23 DIAGNOSIS — O0992 Supervision of high risk pregnancy, unspecified, second trimester: Secondary | ICD-10-CM

## 2019-08-23 DIAGNOSIS — Z3A23 23 weeks gestation of pregnancy: Secondary | ICD-10-CM

## 2019-08-23 DIAGNOSIS — O4402 Placenta previa specified as without hemorrhage, second trimester: Secondary | ICD-10-CM

## 2019-08-23 DIAGNOSIS — Z1389 Encounter for screening for other disorder: Secondary | ICD-10-CM

## 2019-08-23 LAB — POCT URINALYSIS DIPSTICK OB
Blood, UA: NEGATIVE
Glucose, UA: NEGATIVE
Ketones, UA: NEGATIVE
Nitrite, UA: NEGATIVE
POC,PROTEIN,UA: NEGATIVE

## 2019-08-23 NOTE — Progress Notes (Signed)
   HIGH-RISK PREGNANCY VISIT Patient name: Joy Hicks MRN [id number]  Date of birth: 1996/02/11 Chief Complaint:   High Risk Gestation  History of Present Illness:   Joy Hicks is a 23 y.o. G68P0000 female at [redacted]w[redacted]d with an Estimated Date of Delivery: 12/18/19 being seen today for ongoing management of a high-risk pregnancy complicated by placenta previa Today she reports no complaints. Contractions: Not present. Vag. Bleeding: None.  Movement: Present. denies leaking of fluid.  Review of Systems:   Pertinent items are noted in HPI Denies abnormal vaginal discharge w/ itching/odor/irritation, headaches, visual changes, shortness of breath, chest pain, abdominal pain, severe nausea/vomiting, or problems with urination or bowel movements unless otherwise stated above. Pertinent History Reviewed:  Reviewed past medical,surgical, social, obstetrical and family history.  Reviewed problem list, medications and allergies. Physical Assessment:   Vitals:   08/23/19 0941  BP: 120/67  Pulse: 85  Weight: 147 lb 8 oz (66.9 kg)  Body mass index is 26.13 kg/m.           Physical Examination:   General appearance: alert, well appearing, and in no distress  Mental status: alert, oriented to person, place, and time  Skin: warm & dry   Extremities: Edema: None    Cardiovascular: normal heart rate noted  Respiratory: normal respiratory effort, no distress  Abdomen: gravid, soft, non-tender  Pelvic: Cervical exam deferred         Fetal Status: Fetal Heart Rate (bpm): 144 Fundal Height: 24 cm Movement: Present    Fetal Surveillance Testing today: FHR 144   Results for orders placed or performed in visit on 08/23/19 (from the past 24 hour(s))  POC Urinalysis Dipstick OB   Collection Time: 08/23/19  9:42 AM  Result Value Ref Range   Color, UA     Clarity, UA     Glucose, UA Negative Negative   Bilirubin, UA     Ketones, UA neg    Spec Grav, UA     Blood, UA neg    pH, UA     POC,PROTEIN,UA Negative Negative, Trace, Small (1+), Moderate (2+), Large (3+), 4+   Urobilinogen, UA     Nitrite, UA neg    Leukocytes, UA Moderate (2+) (A) Negative   Appearance     Odor      Assessment & Plan:  1) High-risk pregnancy G1P0000 at [redacted]w[redacted]d with an Estimated Date of Delivery: 12/18/19   2) Complete previa, posterior, stable, sonogram next visit  Meds: No orders of the defined types were placed in this encounter.   Labs/procedures today:   Treatment Plan:  Sonogram + PN2 in 4 weeks  Reviewed:  labor symptoms and general obstetric precautions including but not limited to vaginal bleeding, contractions, leaking of fluid and fetal movement were reviewed in detail with the patient.  All questions were answered.  Check bp weekly, let us know if >140/90.   Follow-up: Return in about 4 weeks (around 09/20/2019) for sonogram, PN2, HROB.  Orders Placed This Encounter  Procedures  . US OB Limited  . POC Urinalysis Dipstick OB   Mertie Clause [name]  08/23/2019 10:24 AM

## 2019-09-20 ENCOUNTER — Ambulatory Visit (INDEPENDENT_AMBULATORY_CARE_PROVIDER_SITE_OTHER): Payer: 59 | Admitting: Obstetrics & Gynecology

## 2019-09-20 ENCOUNTER — Other Ambulatory Visit: Payer: 59

## 2019-09-20 ENCOUNTER — Other Ambulatory Visit: Payer: Self-pay

## 2019-09-20 ENCOUNTER — Ambulatory Visit (INDEPENDENT_AMBULATORY_CARE_PROVIDER_SITE_OTHER): Payer: 59

## 2019-09-20 ENCOUNTER — Encounter: Payer: Self-pay | Admitting: Obstetrics & Gynecology

## 2019-09-20 VITALS — BP 108/69 | HR 84 | Wt <= 1120 oz

## 2019-09-20 DIAGNOSIS — Z3A27 27 weeks gestation of pregnancy: Secondary | ICD-10-CM

## 2019-09-20 DIAGNOSIS — O0992 Supervision of high risk pregnancy, unspecified, second trimester: Secondary | ICD-10-CM

## 2019-09-20 DIAGNOSIS — Z23 Encounter for immunization: Secondary | ICD-10-CM | POA: Diagnosis not present

## 2019-09-20 DIAGNOSIS — Z331 Pregnant state, incidental: Secondary | ICD-10-CM

## 2019-09-20 DIAGNOSIS — O4402 Placenta previa specified as without hemorrhage, second trimester: Secondary | ICD-10-CM

## 2019-09-20 DIAGNOSIS — O0990 Supervision of high risk pregnancy, unspecified, unspecified trimester: Secondary | ICD-10-CM

## 2019-09-20 DIAGNOSIS — Z1389 Encounter for screening for other disorder: Secondary | ICD-10-CM

## 2019-09-20 DIAGNOSIS — Z3402 Encounter for supervision of normal first pregnancy, second trimester: Secondary | ICD-10-CM

## 2019-09-20 LAB — POCT URINALYSIS DIPSTICK OB
Blood, UA: NEGATIVE
Ketones, UA: NEGATIVE
Leukocytes, UA: NEGATIVE
Nitrite, UA: NEGATIVE
POC,PROTEIN,UA: NEGATIVE

## 2019-09-20 NOTE — Progress Notes (Signed)
   HIGH-RISK PREGNANCY VISIT Patient name: Joy Hicks MRN [id number]  Date of birth: 08/14/1996 Chief Complaint:   High Risk Gestation (u/s/ PN2)  History of Present Illness:   Joy Hicks is a 23 y.o. G65P0000 female at [redacted]w[redacted]d with an Estimated Date of Delivery: 12/18/19 being seen today for ongoing management of a high-risk pregnancy complicated by placenta previa.  Today she reports some peripheral nerve complaints. Contractions: Not present.  .  Movement: Absent. denies leaking of fluid.  Review of Systems:   Pertinent items are noted in HPI Denies abnormal vaginal discharge w/ itching/odor/irritation, headaches, visual changes, shortness of breath, chest pain, abdominal pain, severe nausea/vomiting, or problems with urination or bowel movements unless otherwise stated above. Pertinent History Reviewed:  Reviewed past medical,surgical, social, obstetrical and family history.  Reviewed problem list, medications and allergies. Physical Assessment:   Vitals:   09/20/19 1121  BP: 108/69  Pulse: 84  Weight: 16 lb (7.258 kg)  Body mass index is 2.83 kg/m.           Physical Examination:   General appearance: alert, well appearing, and in no distress  Mental status: alert, oriented to person, place, and time  Skin: warm & dry   Extremities: Edema: None    Cardiovascular: normal heart rate noted  Respiratory: normal respiratory effort, no distress  Abdomen: gravid, soft, non-tender  Pelvic: Cervical exam deferred         Fetal Status:     Movement: Absent    Fetal Surveillance Testing today: sonogram for previa   Results for orders placed or performed in visit on 09/20/19 (from the past 24 hour(s))  POC Urinalysis Dipstick OB   Collection Time: 09/20/19 11:26 AM  Result Value Ref Range   Color, UA     Clarity, UA     Glucose, UA Moderate (2+) (A) Negative   Bilirubin, UA     Ketones, UA neg    Spec Grav, UA     Blood, UA neg    pH, UA     POC,PROTEIN,UA Negative  Negative, Trace, Small (1+), Moderate (2+), Large (3+), 4+   Urobilinogen, UA     Nitrite, UA neg    Leukocytes, UA Negative Negative   Appearance     Odor      Assessment & Plan:  1) High-risk pregnancy G1P0000 at [redacted]w[redacted]d with an Estimated Date of Delivery: 12/18/19   2) Complete posterior previa, stable    Meds: No orders of the defined types were placed in this encounter.   Labs/procedures today: sonogram + PN2  Treatment Plan:  Repeat scan 4 weeks for eval of previa  Reviewed:  labor symptoms and general obstetric precautions including but not limited to vaginal bleeding, contractions, leaking of fluid and fetal movement were reviewed in detail with the patient.  All questions were answered.  home bp cuff. Rx faxed to . Check bp weekly, let us know if >140/90.   Follow-up: Return in about 4 weeks (around 10/18/2019) for follow up sonogram.  Orders Placed This Encounter  Procedures  . Tdap vaccine greater than or equal to 23yo IM  . POC Urinalysis Dipstick OB   Mertie Clause Naoki Migliaccio  09/20/2019 12:04 PM

## 2019-09-20 NOTE — Progress Notes (Signed)
[place] 50+3 wks,cephalic,complete placenta previa,cx 4.2 cm,fhr 146 bpm,normal ovaries bilat,afi 12.2 cm,

## 2019-09-21 ENCOUNTER — Encounter: Payer: Self-pay | Admitting: Women's Health

## 2019-09-21 ENCOUNTER — Other Ambulatory Visit: Payer: Self-pay | Admitting: *Deleted

## 2019-09-21 DIAGNOSIS — Z3A27 27 weeks gestation of pregnancy: Secondary | ICD-10-CM

## 2019-09-21 DIAGNOSIS — O24410 Gestational diabetes mellitus in pregnancy, diet controlled: Secondary | ICD-10-CM | POA: Insufficient documentation

## 2019-09-21 DIAGNOSIS — O0990 Supervision of high risk pregnancy, unspecified, unspecified trimester: Secondary | ICD-10-CM

## 2019-09-21 LAB — GLUCOSE TOLERANCE, 2 HOURS W/ 1HR
Glucose, 1 hour: 187 mg/dL — ABNORMAL HIGH (ref 65–179)
Glucose, 2 hour: 186 mg/dL — ABNORMAL HIGH (ref 65–152)
Glucose, Fasting: 77 mg/dL (ref 65–91)

## 2019-09-21 LAB — CBC
Hematocrit: 35.7 % (ref 34.0–46.6)
Hemoglobin: 11.7 g/dL (ref 11.1–15.9)
MCH: 30.3 pg (ref 26.6–33.0)
MCHC: 32.8 g/dL (ref 31.5–35.7)
MCV: 93 fL (ref 79–97)
Platelets: 191 10*3/uL (ref 150–450)
RBC: 3.86 x10E6/uL (ref 3.77–5.28)
RDW: 12.8 % (ref 11.7–15.4)
WBC: 7.5 10*3/uL (ref 3.4–10.8)

## 2019-09-21 LAB — ANTIBODY SCREEN: Antibody Screen: NEGATIVE

## 2019-09-21 LAB — HIV ANTIBODY (ROUTINE TESTING W REFLEX): HIV Screen 4th Generation wRfx: NONREACTIVE

## 2019-09-21 LAB — RPR: RPR Ser Ql: NONREACTIVE

## 2019-09-21 MED ORDER — ACCU-CHEK GUIDE VI STRP: ORAL_STRIP | 12 refills | Status: DC

## 2019-09-21 MED ORDER — ACCU-CHEK GUIDE ME W/DEVICE KIT: 1.0000 | PACK | Freq: Four times a day (QID) | 0 refills | Status: DC

## 2019-09-21 MED ORDER — ACCU-CHEK FASTCLIX LANCETS MISC
1.0000 | Freq: Four times a day (QID) | 12 refills | Status: DC
Start: 2019-09-21 — End: 2019-09-29

## 2019-09-22 ENCOUNTER — Telehealth: Payer: Self-pay | Admitting: *Deleted

## 2019-09-22 NOTE — Telephone Encounter (Signed)
Pt is scheduled to see dietician 10/8. I gave her the address and advised they would go over all instructions at that visit. Pt voiced understanding. [place]

## 2019-09-22 NOTE — Telephone Encounter (Signed)
Left message @ 11:33 am. JSY 

## 2019-09-22 NOTE — Telephone Encounter (Signed)
Patient left message stating that she got the results of her glucose test and has questions about checking her blood sugars.

## 2019-09-29 ENCOUNTER — Other Ambulatory Visit: Payer: Self-pay

## 2019-09-29 ENCOUNTER — Ambulatory Visit: Payer: 59 | Admitting: *Deleted

## 2019-09-29 ENCOUNTER — Other Ambulatory Visit: Payer: Self-pay | Admitting: Lactation Services

## 2019-09-29 ENCOUNTER — Encounter: Payer: 59 | Attending: Obstetrics and Gynecology | Admitting: *Deleted

## 2019-09-29 DIAGNOSIS — Z713 Dietary counseling and surveillance: Secondary | ICD-10-CM | POA: Insufficient documentation

## 2019-09-29 DIAGNOSIS — O24419 Gestational diabetes mellitus in pregnancy, unspecified control: Secondary | ICD-10-CM | POA: Insufficient documentation

## 2019-09-29 MED ORDER — GLUCOSE BLOOD VI STRP: ORAL_STRIP | 12 refills | Status: DC

## 2019-09-29 MED ORDER — ONETOUCH VERIO W/DEVICE KIT: 1.0000 | PACK | Freq: Once | 0 refills | Status: AC

## 2019-09-29 MED ORDER — ONETOUCH ULTRASOFT LANCETS MISC: 12 refills | Status: DC

## 2019-09-29 NOTE — Progress Notes (Signed)
Blood Glucose monitoring supplies reordered based on Insurance coverage per Bev Derinda Sis, Diabetes Educator.

## 2019-09-29 NOTE — Progress Notes (Signed)
  Patient was seen on 09/29/2019 for Gestational Diabetes self-management. EDD 12/18/2019. Patient states no history of GDM. Diet history obtained. Patient eats good variety of all food groups. Beverages include water and occasional sweet tea when eating out.  The following learning objectives were met by the patient :   States the definition of Gestational Diabetes  States why dietary management is important in controlling blood glucose  Describes the effects of carbohydrates on blood glucose levels  Demonstrates ability to create a balanced meal plan  Demonstrates carbohydrate counting   States when to check blood glucose levels  Demonstrates proper blood glucose monitoring techniques  States the effect of stress and exercise on blood glucose levels  States the importance of limiting caffeine and abstaining from alcohol and smoking  Plan:  Aim for 3 Carb Choices per meal (45 grams) +/- 1 either way  Aim for 1-2 Carbs per snack Begin reading food labels for Total Carbohydrate of foods If OK with your MD, consider  increasing your activity level by walking, Arm Chair Exercises or other activity daily as tolerated Begin checking BG before breakfast and 2 hours after first bite of breakfast, lunch and dinner as directed by MD  Bring Log Book/Sheet and meter to every medical appointment OR use Baby Scripts (see below) Baby Scripts:  Patient was introduced to [employer] and plans to use as record of BG electronically  Take medication if directed by MD  Accu Chek Guide meter was ordered to her pharmacy but patient has [employer] and they don't cover the Accu Chek brand. I will have the One Touch Verio meter Rx sent into her pharmacy today. I showed her how to use the Verio and also the Fast Clix lancing device that came with the Accu chek meter, but is more convenient to use than the Delica lancing device that comes with the One Touch Verio. She can still use the Fast Clix if she  chooses to.   Blood glucose monitor Rx called into pharmacy: One Touch Verio with strips Patient instructed to test pre breakfast and 2 hours each meal as directed by MD  Patient instructed to monitor glucose levels: FBS: 60 - 95 mg/dl 2 hour: <120 mg/dl  Patient received the following handouts:  Nutrition Diabetes and Pregnancy  Carbohydrate Counting List  Patient will be seen for follow-up as needed.

## 2019-10-17 ENCOUNTER — Other Ambulatory Visit: Payer: Self-pay | Admitting: Obstetrics & Gynecology

## 2019-10-17 DIAGNOSIS — O4400 Placenta previa specified as without hemorrhage, unspecified trimester: Secondary | ICD-10-CM

## 2019-10-18 ENCOUNTER — Ambulatory Visit (INDEPENDENT_AMBULATORY_CARE_PROVIDER_SITE_OTHER): Payer: 59 | Admitting: Women's Health

## 2019-10-18 ENCOUNTER — Encounter: Payer: Self-pay | Admitting: Women's Health

## 2019-10-18 ENCOUNTER — Ambulatory Visit (INDEPENDENT_AMBULATORY_CARE_PROVIDER_SITE_OTHER): Payer: 59

## 2019-10-18 ENCOUNTER — Other Ambulatory Visit: Payer: Self-pay | Admitting: Obstetrics & Gynecology

## 2019-10-18 ENCOUNTER — Other Ambulatory Visit: Payer: Self-pay

## 2019-10-18 VITALS — BP 103/58 | HR 79 | Wt 145.0 lb

## 2019-10-18 DIAGNOSIS — O4403 Placenta previa specified as without hemorrhage, third trimester: Secondary | ICD-10-CM | POA: Diagnosis not present

## 2019-10-18 DIAGNOSIS — O24410 Gestational diabetes mellitus in pregnancy, diet controlled: Secondary | ICD-10-CM

## 2019-10-18 DIAGNOSIS — Z3A31 31 weeks gestation of pregnancy: Secondary | ICD-10-CM

## 2019-10-18 DIAGNOSIS — O4400 Placenta previa specified as without hemorrhage, unspecified trimester: Secondary | ICD-10-CM

## 2019-10-18 DIAGNOSIS — O0993 Supervision of high risk pregnancy, unspecified, third trimester: Secondary | ICD-10-CM

## 2019-10-18 DIAGNOSIS — Z1389 Encounter for screening for other disorder: Secondary | ICD-10-CM

## 2019-10-18 DIAGNOSIS — O0990 Supervision of high risk pregnancy, unspecified, unspecified trimester: Secondary | ICD-10-CM

## 2019-10-18 DIAGNOSIS — Z331 Pregnant state, incidental: Secondary | ICD-10-CM

## 2019-10-18 LAB — POCT URINALYSIS DIPSTICK OB
Blood, UA: NEGATIVE
Glucose, UA: NEGATIVE
Ketones, UA: NEGATIVE
Leukocytes, UA: NEGATIVE
Nitrite, UA: NEGATIVE
POC,PROTEIN,UA: NEGATIVE

## 2019-10-18 NOTE — Progress Notes (Addendum)
HIGH-RISK PREGNANCY VISIT Patient name: Joy Hicks MRN [id number]  Date of birth: 12-22-1996 Chief Complaint:   High Risk Gestation (u/s)  History of Present Illness:   Joy Hicks is a 23 y.o. G57P0000 female at [redacted]w[redacted]d with an Estimated Date of Delivery: 12/18/19 being seen today for ongoing management of a high-risk pregnancy complicated by diabetes mellitus A1DM and complete placenta previa.   Today she reports all sugars wnl. Contractions: Not present.  .  Movement: Present. denies leaking of fluid.  Review of Systems:   Pertinent items are noted in HPI Denies abnormal vaginal discharge w/ itching/odor/irritation, headaches, visual changes, shortness of breath, chest pain, abdominal pain, severe nausea/vomiting, or problems with urination or bowel movements unless otherwise stated above. Pertinent History Reviewed:  Reviewed past medical,surgical, social, obstetrical and family history.  Reviewed problem list, medications and allergies. Physical Assessment:   Vitals:   10/18/19 1147  BP: (!) 103/58  Pulse: 79  Weight: 145 lb (65.8 kg)  Body mass index is 25.69 kg/m.           Physical Examination:   General appearance: alert, well appearing, and in no distress  Mental status: alert, oriented to person, place, and time  Skin: warm & dry   Extremities: Edema: None    Cardiovascular: normal heart rate noted  Respiratory: normal respiratory effort, no distress  Abdomen: gravid, soft, non-tender  Pelvic: Cervical exam deferred         Fetal Status: Fetal Heart Rate (bpm): 157 u/s Fundal Height: 30 cm Movement: Present    Fetal Surveillance Testing today: [place] TA/TV: 31+2 wks,cephalic,complete posterior placenta previa,cx 4.5 cm,afi 12.8 cm,fhr 157 bpm  Chaperone: n/a    Results for orders placed or performed in visit on 10/18/19 (from the past 24 hour(s))  POC Urinalysis Dipstick OB   Collection Time: 10/18/19 11:45 AM  Result Value Ref Range   Color, UA     Clarity,  UA     Glucose, UA Negative Negative   Bilirubin, UA     Ketones, UA neg    Spec Grav, UA     Blood, UA neg    pH, UA     POC,PROTEIN,UA Negative Negative, Trace, Small (1+), Moderate (2+), Large (3+), 4+   Urobilinogen, UA     Nitrite, UA neg    Leukocytes, UA Negative Negative   Appearance     Odor      Assessment & Plan:  1) High-risk pregnancy G1P0000 at [redacted]w[redacted]d with an Estimated Date of Delivery: 12/18/19   2) A1DM, stable  3) Complete placenta previa, no bleeding, discussed w/ JVF, check Hgb if <10.5 plan IV Feraheme, f/u w/ MD in 1wk, continue pelvic rest. Discussed need for PCS. Reviewed warning s/s, reasons to seek care.   Meds: No orders of the defined types were placed in this encounter.   Labs/procedures today: u/s, Declined flu shot. She was advised that the flu shot is recommended during pregnancy to help protect her and her baby. We discussed that it is an inactivated vaccine-so side effects are minimal, it is considered safe to receive during any trimester, and pregnant women are at a higher risk of developing potential complications from the flu, including death.   Treatment Plan:  Cbc today, f/u w/ MD next week  Reviewed: Preterm labor symptoms and general obstetric precautions including but not limited to vaginal bleeding, contractions, leaking of fluid and fetal movement were reviewed in detail with the patient.  All questions  were answered.   Follow-up: Return in about 1 week (around 10/25/2019) for HROB, in person, MD only.  Orders Placed This Encounter  Procedures  . CBC  . POC Urinalysis Dipstick OB   Roma Schanz CNM, [facility] 10/18/2019 12:47 PM

## 2019-10-18 NOTE — Patient Instructions (Signed)
Bruce Donath, I greatly value your feedback.  If you receive a survey following your visit with [place] today, we appreciate you taking the time to fill it out.  Thanks, [name], CNM, [facility]  [facility] HAS MOVED!!! It is now [facility] ([address] [place], [place] [address]) Entrance located off of [address] Free 24/7 valet parking   Go to [email or web address] to register for FREE online childbirth classes    Call the office [phone]) or go to [facility] if:  You begin to have strong, frequent contractions  Your water breaks.  Sometimes it is a big gush of fluid, sometimes it is just a trickle that keeps getting your panties wet or running down your legs  You have vaginal bleeding.  It is normal to have a small amount of spotting if your cervix was checked.   You don't feel your baby moving like normal.  If you don't, get you something to eat and drink and lay down and focus on feeling your baby move.  You should feel at least 10 movements in 2 hours.  If you don't, you should call the office or go to [facility].   Home Blood Pressure Monitoring for Patients   Your provider has recommended that you check your blood pressure (BP) at least once a week at home. If you do not have a blood pressure cuff at home, one will be provided for you. Contact your provider if you have not received your monitor within 1 week.   Helpful Tips for Accurate Home Blood Pressure Checks  . Don't smoke, exercise, or drink caffeine 30 minutes before checking your BP . Use the restroom before checking your BP (a full bladder can raise your pressure) . Relax in a comfortable upright chair . Feet on the ground . Left arm resting comfortably on a flat surface at the level of your heart . Legs uncrossed . Back supported . Sit quietly and don't talk . Place the cuff on your bare arm . Adjust snuggly, so that only two fingertips can fit between your skin  and the top of the cuff . Check 2 readings separated by at least one minute . Keep a log of your BP readings . For a visual, please reference this diagram: http://ccnc.care/bpdiagram  Provider Name: [facility]     Phone: [phone]  Zone 1: ALL CLEAR  Continue to monitor your symptoms:  . BP reading is less than 140 (top number) or less than 90 (bottom number)  . No right upper stomach pain . No headaches or seeing spots . No feeling nauseated or throwing up . No swelling in face and hands  Zone 2: CAUTION Call your doctor's office for any of the following:  . BP reading is greater than 140 (top number) or greater than 90 (bottom number)  . Stomach pain under your ribs in the middle or right side . Headaches or seeing spots . Feeling nauseated or throwing up . Swelling in face and hands  Zone 3: EMERGENCY  Seek immediate medical care if you have any of the following:  . BP reading is greater than160 (top number) or greater than 110 (bottom number) . Severe headaches not improving with Tylenol . Serious difficulty catching your breath . Any worsening symptoms from Zone 2    Placenta Previa Placenta previa is a condition in which the placenta implants in the lower part of the uterus in pregnant  women. The placenta either partially or completely covers the opening to the cervix. This is a problem because the baby must pass through the cervix during delivery. There are three types of placenta previa:  Marginal placenta previa. The placenta reaches within an inch (2.5 cm) of the cervical opening but does not cover it.  Partial placenta previa. The placenta covers part of the cervical opening.  Complete placenta previa. The placenta covers the entire cervical opening. If the previa is marginal or partial and it is diagnosed in the first half of pregnancy, the placenta may move into a normal position as the pregnancy progresses and may no longer cover the cervix. It is  important to keep all prenatal visits with your health care provider so you can be more closely monitored. What are the causes? The cause of this condition is not known. What increases the risk? This condition is more likely to develop in women who:  Are carrying more than one baby (multiples).  Have an abnormally shaped uterus.  Have scars on the lining of the uterus.  Have had surgeries involving the uterus, such as a cesarean delivery.  Have delivered a baby before.  Have a history of placenta previa.  Have smoked or used cocaine during pregnancy.  Are age 23 or older during pregnancy. What are the signs or symptoms? The main symptom of this condition is sudden, painless vaginal bleeding during the second half of pregnancy. The amount of bleeding can be very light at first, and it usually stops on its own. Heavier bleeding episodes may also happen. Some women with placenta previa may have no bleeding at all. How is this diagnosed?  This condition is diagnosed: ? From an ultrasound. This test uses sound waves to find where the placenta is located before you have any bleeding episodes. ? During a checkup after vaginal bleeding is noticed.  If you are diagnosed with a partial or complete previa, digital exams with fingers will generally be avoided. Your health care provider will still perform a speculum exam.  If you did not have an ultrasound during your pregnancy, placenta previa may not be diagnosed until bleeding occurs during labor. How is this treated? Treatment for this condition may include:  Decreased activity.  Bed rest at home or in the hospital.  Pelvic rest. Nothing is placed inside the vagina during pelvic rest. This means not having sex and not using tampons or douches.  A blood transfusion to replace blood that you have lost (maternal blood loss).  A cesarean delivery. This may be performed if: ? The bleeding is heavy and cannot be controlled. ? The  placenta completely covers the cervix.  Medicines to stop premature labor or to help the baby's lungs to mature. This treatment may be used if you need delivery before your pregnancy is full-term. Your treatment will be decided based on:  How much you are bleeding, or whether the bleeding has stopped.  How far along you are in your pregnancy.  The condition of your baby.  The type of placenta previa that you have.  Follow these instructions at home:  Get plenty of rest and lessen activity as told by your health care provider.  Stay on bed rest for as long as told by your health care provider.  Do not have sex, use tampons, use a douche, or place anything inside of your vagina if your health care provider recommended pelvic rest.  Take over-the-counter and prescription medicines as told by your  health care provider.  Keep all follow-up visits as told by your health care provider. This is important. Get help right away if:  You have vaginal bleeding, even if in small amounts and even if you have no pain.  You have cramping or regular contractions.  You have pain in your abdomen or your lower back.  You have a feeling of increased pressure in your pelvis.  You have increased watery or bloody mucus from the vagina. This information is not intended to replace advice given to you by your health care provider. Make sure you discuss any questions you have with your health care provider. Document Released: 12/08/2005 Document Revised: 11/20/2017 Document Reviewed: 06/21/2016 Elsevier Patient Education  2020 [employer].

## 2019-10-18 NOTE — Progress Notes (Signed)
[place] TA/TV: 47+3 wks,cephalic,complete posterior placenta previa,cx 4.5 cm,afi 12.8 cm,fhr 157 bpm

## 2019-10-19 LAB — CBC
Hematocrit: 34.6 % (ref 34.0–46.6)
Hemoglobin: 11.8 g/dL (ref 11.1–15.9)
MCH: 30.9 pg (ref 26.6–33.0)
MCHC: 34.1 g/dL (ref 31.5–35.7)
MCV: 91 fL (ref 79–97)
Platelets: 187 10*3/uL (ref 150–450)
RBC: 3.82 x10E6/uL (ref 3.77–5.28)
RDW: 12.3 % (ref 11.7–15.4)
WBC: 7.3 10*3/uL (ref 3.4–10.8)

## 2019-10-26 ENCOUNTER — Encounter: Payer: 59 | Admitting: Obstetrics and Gynecology

## 2019-10-28 ENCOUNTER — Other Ambulatory Visit: Payer: Self-pay

## 2019-10-28 ENCOUNTER — Encounter: Payer: Self-pay | Admitting: Obstetrics & Gynecology

## 2019-10-28 ENCOUNTER — Ambulatory Visit (INDEPENDENT_AMBULATORY_CARE_PROVIDER_SITE_OTHER): Payer: 59 | Admitting: Obstetrics & Gynecology

## 2019-10-28 VITALS — BP 121/67 | HR 93 | Wt 146.5 lb

## 2019-10-28 DIAGNOSIS — Z1389 Encounter for screening for other disorder: Secondary | ICD-10-CM

## 2019-10-28 DIAGNOSIS — O0990 Supervision of high risk pregnancy, unspecified, unspecified trimester: Secondary | ICD-10-CM

## 2019-10-28 DIAGNOSIS — Z3A32 32 weeks gestation of pregnancy: Secondary | ICD-10-CM

## 2019-10-28 DIAGNOSIS — Z331 Pregnant state, incidental: Secondary | ICD-10-CM

## 2019-10-28 DIAGNOSIS — O24410 Gestational diabetes mellitus in pregnancy, diet controlled: Secondary | ICD-10-CM

## 2019-10-28 DIAGNOSIS — O0993 Supervision of high risk pregnancy, unspecified, third trimester: Secondary | ICD-10-CM

## 2019-10-28 DIAGNOSIS — O4403 Placenta previa specified as without hemorrhage, third trimester: Secondary | ICD-10-CM

## 2019-10-28 LAB — POCT URINALYSIS DIPSTICK OB
Blood, UA: NEGATIVE
Glucose, UA: NEGATIVE
Ketones, UA: NEGATIVE
Nitrite, UA: NEGATIVE
POC,PROTEIN,UA: NEGATIVE

## 2019-10-28 NOTE — Progress Notes (Signed)
   HIGH-RISK PREGNANCY VISIT Patient name: Joy Hicks MRN [id number]  Date of birth: 08-Oct-1996 Chief Complaint:   High Risk Gestation  History of Present Illness:   Joy Hicks is a 23 y.o. G19P0000 female at [redacted]w[redacted]d with an Estimated Date of Delivery: 12/18/19 being seen today for ongoing management of a high-risk pregnancy complicated by Class A1 DM, complete placenta previa, posterior.  Today she reports no complaints. Contractions: Not present. Vag. Bleeding: None.  Movement: Present. denies leaking of fluid.  Review of Systems:   Pertinent items are noted in HPI Denies abnormal vaginal discharge w/ itching/odor/irritation, headaches, visual changes, shortness of breath, chest pain, abdominal pain, severe nausea/vomiting, or problems with urination or bowel movements unless otherwise stated above. Pertinent History Reviewed:  Reviewed past medical,surgical, social, obstetrical and family history.  Reviewed problem list, medications and allergies. Physical Assessment:   Vitals:   10/28/19 0922  BP: 121/67  Pulse: 93  Weight: 146 lb 8 oz (66.5 kg)  Body mass index is 25.95 kg/m.           Physical Examination:   General appearance: alert, well appearing, and in no distress  Mental status: alert, oriented to person, place, and time  Skin: warm & dry   Extremities: Edema: None    Cardiovascular: normal heart rate noted  Respiratory: normal respiratory effort, no distress  Abdomen: gravid, soft, non-tender  Pelvic: Cervical exam deferred         Fetal Status:     Movement: Present    Fetal Surveillance Testing today:    Chaperone: n/a    Results for orders placed or performed in visit on 10/28/19 (from the past 24 hour(s))  POC Urinalysis Dipstick OB   Collection Time: 10/28/19  9:23 AM  Result Value Ref Range   Color, UA     Clarity, UA     Glucose, UA Negative Negative   Bilirubin, UA     Ketones, UA neg    Spec Grav, UA     Blood, UA neg    pH, UA     POC,PROTEIN,UA Negative Negative, Trace, Small (1+), Moderate (2+), Large (3+), 4+   Urobilinogen, UA     Nitrite, UA neg    Leukocytes, UA Trace (A) Negative   Appearance     Odor      Assessment & Plan:  1) High-risk pregnancy G1P0000 at [redacted]w[redacted]d with an Estimated Date of Delivery: 12/18/19   2) Class A1 DM, stable      3) Complete placenta previa, stable, no bleeding  Meds: No orders of the defined types were placed in this encounter.   Labs/procedures today: none  Treatment Plan:  Recheck sonogram at 35+weeks and wil schedule C section 36-37 weeks  Reviewed: Preterm labor symptoms and general obstetric precautions including but not limited to vaginal bleeding, contractions, leaking of fluid and fetal movement were reviewed in detail with the patient.  All questions were answered. Has home bp cuff. Rx faxed to . Check bp weekly, let us know if >140/90.   Follow-up: No follow-ups on file.  Orders Placed This Encounter  Procedures  . POC Urinalysis Dipstick OB   Mertie Clause Pamela Intrieri CNM, 10/28/2019 9:42 AM

## 2019-11-11 ENCOUNTER — Other Ambulatory Visit: Payer: Self-pay

## 2019-11-11 ENCOUNTER — Ambulatory Visit (INDEPENDENT_AMBULATORY_CARE_PROVIDER_SITE_OTHER): Payer: 59 | Admitting: Obstetrics & Gynecology

## 2019-11-11 VITALS — BP 122/68 | HR 101 | Wt 146.0 lb

## 2019-11-11 DIAGNOSIS — Z1389 Encounter for screening for other disorder: Secondary | ICD-10-CM

## 2019-11-11 DIAGNOSIS — O4403 Placenta previa specified as without hemorrhage, third trimester: Secondary | ICD-10-CM

## 2019-11-11 DIAGNOSIS — Z331 Pregnant state, incidental: Secondary | ICD-10-CM

## 2019-11-11 DIAGNOSIS — O24410 Gestational diabetes mellitus in pregnancy, diet controlled: Secondary | ICD-10-CM

## 2019-11-11 DIAGNOSIS — Z3A34 34 weeks gestation of pregnancy: Secondary | ICD-10-CM

## 2019-11-11 DIAGNOSIS — O0993 Supervision of high risk pregnancy, unspecified, third trimester: Secondary | ICD-10-CM

## 2019-11-11 LAB — POCT URINALYSIS DIPSTICK OB
Blood, UA: NEGATIVE
Glucose, UA: NEGATIVE
Ketones, UA: NEGATIVE
Leukocytes, UA: NEGATIVE
Nitrite, UA: NEGATIVE
POC,PROTEIN,UA: NEGATIVE

## 2019-11-11 NOTE — Progress Notes (Signed)
   HIGH-RISK PREGNANCY VISIT Patient name: Joy Hicks MRN [id number]  Date of birth: May 21, 1996 Chief Complaint:   Routine Prenatal Visit  History of Present Illness:   Joy Hicks is a 23 y.o. G81P0000 female at [redacted]w[redacted]d with an Estimated Date of Delivery: 12/18/19 being seen today for ongoing management of a high-risk pregnancy complicated by complete previa and gestational diabetes, diet controlled.  Today she reports no bleeding. Contractions: Not present. Vag. Bleeding: None.  Movement: Present. denies leaking of fluid.  Review of Systems:   Pertinent items are noted in HPI Denies abnormal vaginal discharge w/ itching/odor/irritation, headaches, visual changes, shortness of breath, chest pain, abdominal pain, severe nausea/vomiting, or problems with urination or bowel movements unless otherwise stated above. Pertinent History Reviewed:  Reviewed past medical,surgical, social, obstetrical and family history.  Reviewed problem list, medications and allergies. Physical Assessment:   Vitals:   11/11/19 0859  BP: 122/68  Pulse: (!) 101  Weight: 146 lb (66.2 kg)  Body mass index is 25.86 kg/m.           Physical Examination:   General appearance: alert, well appearing, and in no distress  Mental status: alert, oriented to person, place, and time  Skin: warm & dry   Extremities: Edema: None    Cardiovascular: normal heart rate noted  Respiratory: normal respiratory effort, no distress  Abdomen: gravid, soft, non-tender  Pelvic: Cervical exam deferred         Fetal Status:     Movement: Present    Fetal Surveillance Testing today:    Chaperone: n/a    Results for orders placed or performed in visit on 11/11/19 (from the past 24 hour(s))  POC Urinalysis Dipstick OB   Collection Time: 11/11/19  9:01 AM  Result Value Ref Range   Color, UA     Clarity, UA     Glucose, UA Negative Negative   Bilirubin, UA     Ketones, UA neg    Spec Grav, UA     Blood, UA neg    pH, UA      POC,PROTEIN,UA Negative Negative, Trace, Small (1+), Moderate (2+), Large (3+), 4+   Urobilinogen, UA     Nitrite, UA neg    Leukocytes, UA Negative Negative   Appearance     Odor      Assessment & Plan:  1) High-risk pregnancy G1P0000 at [redacted]w[redacted]d with an Estimated Date of Delivery: 12/18/19   2) Complete previa, stable, no bleeding, recheck 12/1, c section scheduled 12/8 [redacted]w[redacted]d  3) Class A1 DM, stable, 95% of CBG are in range occasional dietary indiscretion  Meds: No orders of the defined types were placed in this encounter.   Labs/procedures today:   Treatment Plan:  Primary C section scheduled for 11/29/19@10  am Sonogram 12/1 to document placental location and see me  Reviewed: Preterm labor symptoms and general obstetric precautions including but not limited to vaginal bleeding, contractions, leaking of fluid and fetal movement were reviewed in detail with the patient.  All questions were answered.  home bp cuff. Rx faxed to . Check bp weekly, let us know if >140/90.   Follow-up: Return in about 11 days (around 11/22/2019) for sonogram + HROB visit , with Dr Elonda Husky.  Orders Placed This Encounter  Procedures  . US OB Limited  . POC Urinalysis Dipstick OB   Florian Buff  11/11/2019 9:26 AM

## 2019-11-14 ENCOUNTER — Encounter (HOSPITAL_COMMUNITY): Payer: Self-pay

## 2019-11-14 NOTE — Patient Instructions (Signed)
Joy Hicks  11/14/2019   Your procedure is scheduled on:  11/29/2019  Arrive at 59 at Entrance C on [address] [facility]  and [address]. You are invited to use the FREE valet parking or use the Visitor's parking deck.  Pick up the phone at the desk and dial [phone].  Call this number if you have problems the morning of surgery: [phone]  Remember:   Do not eat food:(After Midnight) Desps de medianoche.  Do not drink clear liquids: (After Midnight) Desps de medianoche.  Take these medicines the morning of surgery with A SIP OF WATER:  none   Do not wear jewelry, make-up or nail polish.  Do not wear lotions, powders, or perfumes. Do not wear deodorant.  Do not shave 48 hours prior to surgery.  Do not bring valuables to the hospital.  [facility] is not   responsible for any belongings or valuables brought to the hospital.  Contacts, dentures or bridgework may not be worn into surgery.  Leave suitcase in the car. After surgery it may be brought to your room.  For patients admitted to the hospital, checkout time is 11:00 AM the day of              discharge.      Please read over the following fact sheets that you were given:     Preparing for Surgery

## 2019-11-21 ENCOUNTER — Other Ambulatory Visit: Payer: Self-pay | Admitting: Obstetrics & Gynecology

## 2019-11-21 DIAGNOSIS — O4403 Placenta previa specified as without hemorrhage, third trimester: Secondary | ICD-10-CM

## 2019-11-22 ENCOUNTER — Ambulatory Visit (INDEPENDENT_AMBULATORY_CARE_PROVIDER_SITE_OTHER): Payer: 59

## 2019-11-22 ENCOUNTER — Other Ambulatory Visit: Payer: Self-pay

## 2019-11-22 ENCOUNTER — Encounter: Payer: Self-pay | Admitting: Obstetrics & Gynecology

## 2019-11-22 ENCOUNTER — Ambulatory Visit (INDEPENDENT_AMBULATORY_CARE_PROVIDER_SITE_OTHER): Payer: 59 | Admitting: Obstetrics & Gynecology

## 2019-11-22 VITALS — BP 107/70 | HR 80 | Wt 148.0 lb

## 2019-11-22 DIAGNOSIS — O4403 Placenta previa specified as without hemorrhage, third trimester: Secondary | ICD-10-CM

## 2019-11-22 DIAGNOSIS — Z3483 Encounter for supervision of other normal pregnancy, third trimester: Secondary | ICD-10-CM

## 2019-11-22 DIAGNOSIS — Z3A36 36 weeks gestation of pregnancy: Secondary | ICD-10-CM

## 2019-11-22 DIAGNOSIS — Z362 Encounter for other antenatal screening follow-up: Secondary | ICD-10-CM

## 2019-11-22 DIAGNOSIS — O0993 Supervision of high risk pregnancy, unspecified, third trimester: Secondary | ICD-10-CM

## 2019-11-22 DIAGNOSIS — Z331 Pregnant state, incidental: Secondary | ICD-10-CM

## 2019-11-22 DIAGNOSIS — O0990 Supervision of high risk pregnancy, unspecified, unspecified trimester: Secondary | ICD-10-CM

## 2019-11-22 DIAGNOSIS — Z1389 Encounter for screening for other disorder: Secondary | ICD-10-CM

## 2019-11-22 DIAGNOSIS — O24410 Gestational diabetes mellitus in pregnancy, diet controlled: Secondary | ICD-10-CM

## 2019-11-22 LAB — POCT URINALYSIS DIPSTICK OB
Blood, UA: NEGATIVE
Glucose, UA: NEGATIVE
Ketones, UA: NEGATIVE
Leukocytes, UA: NEGATIVE
Nitrite, UA: NEGATIVE
POC,PROTEIN,UA: NEGATIVE

## 2019-11-22 MED ORDER — FLUCONAZOLE 150 MG PO TABS: ORAL_TABLET | ORAL | 1 refills | Status: DC

## 2019-11-22 NOTE — Progress Notes (Signed)
   HIGH-RISK PREGNANCY VISIT Patient name: Joy Hicks MRN [id number]  Date of birth: [birth year] Chief Complaint:   High Risk Gestation (U/S/ gbs-gc-chl)  History of Present Illness:   Joy Hicks is a 23 y.o. G73P0000 female at [redacted]w[redacted]d with an Estimated Date of Delivery: 12/18/19 being seen today for ongoing management of a high-risk pregnancy complicated by posterior placenta previa.  Today she reports no complaints. Contractions: Not present.  .  Movement: Present. denies leaking of fluid.  Review of Systems:   Pertinent items are noted in HPI Denies abnormal vaginal discharge w/ itching/odor/irritation, headaches, visual changes, shortness of breath, chest pain, abdominal pain, severe nausea/vomiting, or problems with urination or bowel movements unless otherwise stated above. Pertinent History Reviewed:  Reviewed past medical,surgical, social, obstetrical and family history.  Reviewed problem list, medications and allergies. Physical Assessment:   Vitals:   11/22/19 1223  BP: 107/70  Pulse: 80  Weight: 148 lb (67.1 kg)  Body mass index is 27.07 kg/m.           Physical Examination:   General appearance: alert, well appearing, and in no distress  Mental status: alert, oriented to person, place, and time  Skin: warm & dry   Extremities: Edema: None    Cardiovascular: normal heart rate noted  Respiratory: normal respiratory effort, no distress  Abdomen: gravid, soft, non-tender  Pelvic: Cervical exam deferred         Fetal Status:     Movement: Present    Fetal Surveillance Testing today: sonogram + cultures   Chaperone: Diona Fanti    Results for orders placed or performed in visit on 11/22/19 (from the past 24 hour(s))  POC Urinalysis Dipstick OB   Collection Time: 11/22/19 11:50 AM  Result Value Ref Range   Color, UA     Clarity, UA     Glucose, UA Negative Negative   Bilirubin, UA     Ketones, UA neg    Spec Grav, UA     Blood, UA neg    pH, UA     POC,PROTEIN,UA Negative Negative, Trace, Small (1+), Moderate (2+), Large (3+), 4+   Urobilinogen, UA     Nitrite, UA neg    Leukocytes, UA Negative Negative   Appearance     Odor      Assessment & Plan:  1) High-risk pregnancy G1P0000 at [redacted]w[redacted]d with an Estimated Date of Delivery: 12/18/19   2) Placenta previa, posterior, stable  3) Class A1 DM, stable  Meds: No orders of the defined types were placed in this encounter.   Labs/procedures today: sonogram  Treatment Plan:  Scheduled C section next week  Reviewed: Term labor symptoms and general obstetric precautions including but not limited to vaginal bleeding, contractions, leaking of fluid and fetal movement were reviewed in detail with the patient.  All questions were answered.  home bp cuff. Rx faxed to  Check bp weekly, let us know if >140/90.   Follow-up: Return in about 2 weeks (around 12/06/2019) for [place], with Dr Elonda Husky.  Orders Placed This Encounter  Procedures  . Strep Gp B NAA  . GC/Chlamydia Probe Amp  . POC Urinalysis Dipstick OB   Florian Buff  11/22/2019 12:33 PM

## 2019-11-22 NOTE — Progress Notes (Signed)
[place] AC/ZY:60+6 wks,cephalic,fhr 301 bpm,cx 4.2 cm,AFI 13 cm,complete posterior previa best viewed transabdominally,reviewed images w/Dr.Eure

## 2019-11-22 NOTE — Addendum Note (Signed)
Addended by: Florian Buff on: 11/22/2019 12:42 PM   Modules accepted: Orders

## 2019-11-24 LAB — STREP GP B NAA: Strep Gp B NAA: NEGATIVE

## 2019-11-24 LAB — GC/CHLAMYDIA PROBE AMP
Chlamydia trachomatis, NAA: NEGATIVE
Neisseria Gonorrhoeae by PCR: NEGATIVE

## 2019-11-25 ENCOUNTER — Other Ambulatory Visit: Payer: Self-pay | Admitting: Obstetrics & Gynecology

## 2019-11-27 ENCOUNTER — Ambulatory Visit (HOSPITAL_COMMUNITY)
Admission: RE | Admit: 2019-11-27 | Discharge: 2019-11-27 | Disposition: A | Discharge status: Home / Self Care | Payer: 59 | Source: Ambulatory Visit | Attending: Obstetrics & Gynecology | Admitting: Obstetrics & Gynecology

## 2019-11-27 ENCOUNTER — Other Ambulatory Visit: Payer: Self-pay

## 2019-11-27 DIAGNOSIS — Z20828 Contact with and (suspected) exposure to other viral communicable diseases: Secondary | ICD-10-CM | POA: Insufficient documentation

## 2019-11-27 HISTORY — DX: Other specified health status: Z78.9

## 2019-11-27 LAB — SARS CORONAVIRUS 2 (TAT 6-24 HRS): SARS Coronavirus 2: NEGATIVE

## 2019-11-27 LAB — CBC
HCT: 36.3 % (ref 36.0–46.0)
Hemoglobin: 12.4 g/dL (ref 12.0–15.0)
MCH: 30 pg (ref 26.0–34.0)
MCHC: 34.2 g/dL (ref 30.0–36.0)
MCV: 87.9 fL (ref 80.0–100.0)
Platelets: 161 10*3/uL (ref 150–400)
RBC: 4.13 MIL/uL (ref 3.87–5.11)
RDW: 12.6 % (ref 11.5–15.5)
WBC: 8 10*3/uL (ref 4.0–10.5)
nRBC: 0 % (ref 0.0–0.2)

## 2019-11-27 LAB — RPR: RPR Ser Ql: NONREACTIVE

## 2019-11-27 LAB — TYPE AND SCREEN
ABO/RH(D): O POS
Antibody Screen: NEGATIVE

## 2019-11-27 LAB — ABO/RH: ABO/RH(D): O POS

## 2019-11-27 NOTE — MAU Note (Signed)
Pt here for PAT covid swab and lab draw. States she has had some "mucus in her nasal cavity". States it is intermittent and denies any other covid symptoms. Swab collected. Pt verbalizes understanding to not remove blood bank bracelet.

## 2019-11-29 ENCOUNTER — Encounter (HOSPITAL_COMMUNITY): Payer: Self-pay | Admitting: *Deleted

## 2019-11-29 ENCOUNTER — Inpatient Hospital Stay (HOSPITAL_COMMUNITY): Payer: 59 | Admitting: Anesthesiology

## 2019-11-29 ENCOUNTER — Encounter (HOSPITAL_COMMUNITY)
Admission: RE | Disposition: A | Discharge status: Home / Self Care | Payer: Self-pay | Source: Home / Self Care | Attending: Obstetrics & Gynecology

## 2019-11-29 ENCOUNTER — Inpatient Hospital Stay (HOSPITAL_COMMUNITY)
Admission: RE | Admit: 2019-11-29 | Discharge: 2019-12-01 | DRG: 788 | Disposition: A | Discharge status: Home / Self Care | Payer: 59 | Attending: Obstetrics & Gynecology | Admitting: Obstetrics & Gynecology

## 2019-11-29 ENCOUNTER — Other Ambulatory Visit: Payer: Self-pay

## 2019-11-29 DIAGNOSIS — O24410 Gestational diabetes mellitus in pregnancy, diet controlled: Secondary | ICD-10-CM | POA: Diagnosis present

## 2019-11-29 DIAGNOSIS — Z3A37 37 weeks gestation of pregnancy: Secondary | ICD-10-CM

## 2019-11-29 DIAGNOSIS — O9081 Anemia of the puerperium: Secondary | ICD-10-CM | POA: Diagnosis not present

## 2019-11-29 DIAGNOSIS — L509 Urticaria, unspecified: Secondary | ICD-10-CM | POA: Diagnosis not present

## 2019-11-29 DIAGNOSIS — R21 Rash and other nonspecific skin eruption: Secondary | ICD-10-CM | POA: Diagnosis not present

## 2019-11-29 DIAGNOSIS — O24420 Gestational diabetes mellitus in childbirth, diet controlled: Secondary | ICD-10-CM | POA: Diagnosis present

## 2019-11-29 DIAGNOSIS — T7840XA Allergy, unspecified, initial encounter: Secondary | ICD-10-CM | POA: Diagnosis not present

## 2019-11-29 DIAGNOSIS — O9089 Other complications of the puerperium, not elsewhere classified: Secondary | ICD-10-CM | POA: Diagnosis not present

## 2019-11-29 DIAGNOSIS — O4403 Placenta previa specified as without hemorrhage, third trimester: Principal | ICD-10-CM | POA: Diagnosis present

## 2019-11-29 DIAGNOSIS — D649 Anemia, unspecified: Secondary | ICD-10-CM | POA: Diagnosis not present

## 2019-11-29 DIAGNOSIS — O4400 Placenta previa specified as without hemorrhage, unspecified trimester: Secondary | ICD-10-CM

## 2019-11-29 DIAGNOSIS — O24419 Gestational diabetes mellitus in pregnancy, unspecified control: Secondary | ICD-10-CM

## 2019-11-29 LAB — GLUCOSE, CAPILLARY
Glucose-Capillary: 75 mg/dL (ref 70–99)
Glucose-Capillary: 76 mg/dL (ref 70–99)

## 2019-11-29 SURGERY — Surgical Case
Anesthesia: *Unknown

## 2019-11-29 SURGERY — Surgical Case
Anesthesia: Spinal

## 2019-11-29 MED ORDER — SIMETHICONE 80 MG PO CHEW
80.0000 mg | CHEWABLE_TABLET | Freq: Three times a day (TID) | ORAL | Status: DC
  Administered 2019-11-30 – 2019-12-01 (×5): 80 mg via ORAL
  Filled 2019-11-29 (×5): qty 1

## 2019-11-29 MED ORDER — DIPHENHYDRAMINE HCL 50 MG/ML IJ SOLN
INTRAMUSCULAR | Status: DC | PRN
  Administered 2019-11-29 (×2): 25 mg via INTRAVENOUS

## 2019-11-29 MED ORDER — FENTANYL CITRATE (PF) 100 MCG/2ML IJ SOLN: 25.0000 ug | INTRAMUSCULAR | Status: DC | PRN

## 2019-11-29 MED ORDER — OXYCODONE HCL 5 MG/5ML PO SOLN: 5.0000 mg | Freq: Once | ORAL | Status: DC | PRN

## 2019-11-29 MED ORDER — ACETAMINOPHEN 325 MG PO TABS: 325.0000 mg | ORAL_TABLET | ORAL | Status: DC | PRN

## 2019-11-29 MED ORDER — MENTHOL 3 MG MT LOZG: 1.0000 | LOZENGE | OROMUCOSAL | Status: DC | PRN

## 2019-11-29 MED ORDER — DIPHENHYDRAMINE HCL 50 MG/ML IJ SOLN
INTRAMUSCULAR | Status: AC
  Filled 2019-11-29: qty 1

## 2019-11-29 MED ORDER — BUPIVACAINE IN DEXTROSE 0.75-8.25 % IT SOLN
INTRATHECAL | Status: DC | PRN
  Administered 2019-11-29: 12.75 mg via INTRATHECAL

## 2019-11-29 MED ORDER — OXYTOCIN 10 UNIT/ML IJ SOLN
INTRAMUSCULAR | Status: DC | PRN
  Administered 2019-11-29: 40 [IU] via INTRAMUSCULAR

## 2019-11-29 MED ORDER — METHYLPREDNISOLONE SODIUM SUCC 125 MG IJ SOLR
INTRAMUSCULAR | Status: AC
  Filled 2019-11-29: qty 2

## 2019-11-29 MED ORDER — ENOXAPARIN SODIUM 40 MG/0.4ML ~~LOC~~ SOLN
40.0000 mg | SUBCUTANEOUS | Status: DC
  Administered 2019-11-30 – 2019-12-01 (×2): 40 mg via SUBCUTANEOUS
  Filled 2019-11-29 (×2): qty 0.4

## 2019-11-29 MED ORDER — FENTANYL CITRATE (PF) 100 MCG/2ML IJ SOLN
25.0000 ug | INTRAMUSCULAR | Status: DC | PRN
  Administered 2019-11-29: 10:00:00 15 ug via INTRAVENOUS

## 2019-11-29 MED ORDER — LACTATED RINGERS IV SOLN
INTRAVENOUS | Status: DC
  Administered 2019-11-29: via INTRAVENOUS

## 2019-11-29 MED ORDER — CEFAZOLIN SODIUM-DEXTROSE 2-4 GM/100ML-% IV SOLN
2.0000 g | INTRAVENOUS | Status: AC
  Administered 2019-11-29: 10:00:00 2 g via INTRAVENOUS

## 2019-11-29 MED ORDER — OXYCODONE HCL 5 MG PO TABS: 5.0000 mg | ORAL_TABLET | ORAL | Status: DC | PRN

## 2019-11-29 MED ORDER — METHYLERGONOVINE MALEATE 0.2 MG/ML IJ SOLN
INTRAMUSCULAR | Status: AC
  Filled 2019-11-29: qty 1

## 2019-11-29 MED ORDER — ONDANSETRON HCL 4 MG/2ML IJ SOLN
INTRAMUSCULAR | Status: DC | PRN
  Administered 2019-11-29: 4 mg via INTRAVENOUS

## 2019-11-29 MED ORDER — ZOLPIDEM TARTRATE 5 MG PO TABS: 5.0000 mg | ORAL_TABLET | Freq: Every evening | ORAL | Status: DC | PRN

## 2019-11-29 MED ORDER — SIMETHICONE 80 MG PO CHEW
80.0000 mg | CHEWABLE_TABLET | ORAL | Status: DC
  Administered 2019-11-29 – 2019-11-30 (×2): 80 mg via ORAL
  Filled 2019-11-29 (×2): qty 1

## 2019-11-29 MED ORDER — OXYCODONE HCL 5 MG PO TABS: 5.0000 mg | ORAL_TABLET | Freq: Once | ORAL | Status: DC | PRN

## 2019-11-29 MED ORDER — ONDANSETRON HCL 4 MG/2ML IJ SOLN
INTRAMUSCULAR | Status: AC
  Filled 2019-11-29: qty 2

## 2019-11-29 MED ORDER — MEPERIDINE HCL 25 MG/ML IJ SOLN: 6.2500 mg | INTRAMUSCULAR | Status: DC | PRN

## 2019-11-29 MED ORDER — TETANUS-DIPHTH-ACELL PERTUSSIS 5-2.5-18.5 LF-MCG/0.5 IM SUSP: 0.5000 mL | Freq: Once | INTRAMUSCULAR | Status: DC

## 2019-11-29 MED ORDER — PHENYLEPHRINE HCL-NACL 20-0.9 MG/250ML-% IV SOLN
INTRAVENOUS | Status: AC
  Filled 2019-11-29: qty 250

## 2019-11-29 MED ORDER — OXYTOCIN 40 UNITS IN NORMAL SALINE INFUSION - SIMPLE MED
INTRAVENOUS | Status: AC
  Filled 2019-11-29: qty 1000

## 2019-11-29 MED ORDER — PRENATAL MULTIVITAMIN CH
1.0000 | ORAL_TABLET | Freq: Every day | ORAL | Status: DC
  Administered 2019-11-30 – 2019-12-01 (×2): 1 via ORAL
  Filled 2019-11-29 (×2): qty 1

## 2019-11-29 MED ORDER — METHYLERGONOVINE MALEATE 0.2 MG/ML IJ SOLN
INTRAMUSCULAR | Status: DC | PRN
  Administered 2019-11-29: 0.2 mg via INTRAMUSCULAR

## 2019-11-29 MED ORDER — CEFAZOLIN SODIUM-DEXTROSE 2-4 GM/100ML-% IV SOLN
INTRAVENOUS | Status: AC
  Filled 2019-11-29: qty 100

## 2019-11-29 MED ORDER — PHENYLEPHRINE HCL-NACL 20-0.9 MG/250ML-% IV SOLN
INTRAVENOUS | Status: DC | PRN
  Administered 2019-11-29: 60 ug/min via INTRAVENOUS

## 2019-11-29 MED ORDER — COCONUT OIL OIL: 1.0000 "application " | TOPICAL_OIL | Status: DC | PRN

## 2019-11-29 MED ORDER — IBUPROFEN 800 MG PO TABS
800.0000 mg | ORAL_TABLET | Freq: Three times a day (TID) | ORAL | Status: DC
  Administered 2019-11-29 – 2019-12-01 (×7): 800 mg via ORAL
  Filled 2019-11-29 (×7): qty 1

## 2019-11-29 MED ORDER — MORPHINE SULFATE (PF) 0.5 MG/ML IJ SOLN
INTRAMUSCULAR | Status: AC
  Filled 2019-11-29: qty 10

## 2019-11-29 MED ORDER — MORPHINE SULFATE (PF) 0.5 MG/ML IJ SOLN
INTRAMUSCULAR | Status: DC | PRN
  Administered 2019-11-29: 150 ug via INTRATHECAL

## 2019-11-29 MED ORDER — OXYTOCIN 40 UNITS IN NORMAL SALINE INFUSION - SIMPLE MED
2.5000 [IU]/h | INTRAVENOUS | Status: AC
  Administered 2019-11-29: 2.5 [IU]/h via INTRAVENOUS
  Filled 2019-11-29: qty 1000

## 2019-11-29 MED ORDER — ONDANSETRON HCL 4 MG/2ML IJ SOLN: 4.0000 mg | Freq: Once | INTRAMUSCULAR | Status: DC | PRN

## 2019-11-29 MED ORDER — DIPHENHYDRAMINE HCL 25 MG PO CAPS: 25.0000 mg | ORAL_CAPSULE | Freq: Four times a day (QID) | ORAL | Status: DC | PRN

## 2019-11-29 MED ORDER — ACETAMINOPHEN 325 MG PO TABS
650.0000 mg | ORAL_TABLET | Freq: Four times a day (QID) | ORAL | Status: DC | PRN
  Administered 2019-11-30: 650 mg via ORAL
  Filled 2019-11-29: qty 2

## 2019-11-29 MED ORDER — ACETAMINOPHEN 160 MG/5ML PO SOLN: 325.0000 mg | ORAL | Status: DC | PRN

## 2019-11-29 MED ORDER — TRANEXAMIC ACID-NACL 1000-0.7 MG/100ML-% IV SOLN
INTRAVENOUS | Status: AC
  Filled 2019-11-29: qty 100

## 2019-11-29 MED ORDER — METHYLPREDNISOLONE SODIUM SUCC 125 MG IJ SOLR
INTRAMUSCULAR | Status: DC | PRN
  Administered 2019-11-29: 125 mg via INTRAVENOUS

## 2019-11-29 MED ORDER — SENNOSIDES-DOCUSATE SODIUM 8.6-50 MG PO TABS
2.0000 | ORAL_TABLET | ORAL | Status: DC
  Administered 2019-11-29 – 2019-11-30 (×2): 2 via ORAL
  Filled 2019-11-29 (×2): qty 2

## 2019-11-29 MED ORDER — SODIUM CHLORIDE 0.9 % IV SOLN
INTRAVENOUS | Status: DC | PRN
  Administered 2019-11-29: 10:00:00 via INTRAVENOUS

## 2019-11-29 MED ORDER — FENTANYL CITRATE (PF) 100 MCG/2ML IJ SOLN
INTRAMUSCULAR | Status: AC
  Filled 2019-11-29: qty 2

## 2019-11-29 MED ORDER — LACTATED RINGERS IV SOLN
INTRAVENOUS | Status: DC
  Administered 2019-11-29: 09:00:00 via INTRAVENOUS

## 2019-11-29 MED ORDER — TRANEXAMIC ACID-NACL 1000-0.7 MG/100ML-% IV SOLN
1000.0000 mg | INTRAVENOUS | Status: AC
  Administered 2019-11-29: 10:00:00 1000 mg via INTRAVENOUS

## 2019-11-29 MED ORDER — WITCH HAZEL-GLYCERIN EX PADS: 1.0000 "application " | MEDICATED_PAD | CUTANEOUS | Status: DC | PRN

## 2019-11-29 MED ORDER — SIMETHICONE 80 MG PO CHEW
80.0000 mg | CHEWABLE_TABLET | ORAL | Status: DC | PRN
  Administered 2019-11-30: 80 mg via ORAL
  Filled 2019-11-29: qty 1

## 2019-11-29 MED ORDER — DIBUCAINE (PERIANAL) 1 % EX OINT: 1.0000 "application " | TOPICAL_OINTMENT | CUTANEOUS | Status: DC | PRN

## 2019-11-29 MED ORDER — DEXAMETHASONE SODIUM PHOSPHATE 10 MG/ML IJ SOLN
INTRAMUSCULAR | Status: AC
  Filled 2019-11-29: qty 1

## 2019-11-29 SURGICAL SUPPLY — 37 items
CHLORAPREP W/TINT 26ML (MISCELLANEOUS) ×3 IMPLANT
CLAMP CORD UMBIL (MISCELLANEOUS) IMPLANT
CLOTH BEACON ORANGE TIMEOUT ST (SAFETY) ×3 IMPLANT
DERMABOND ADVANCED (GAUZE/BANDAGES/DRESSINGS) ×4
DERMABOND ADVANCED .7 DNX12 (GAUZE/BANDAGES/DRESSINGS) ×2 IMPLANT
DRSG OPSITE POSTOP 4X10 (GAUZE/BANDAGES/DRESSINGS) ×3 IMPLANT
ELECT REM PT RETURN 9FT ADLT (ELECTROSURGICAL) ×3
ELECTRODE REM PT RTRN 9FT ADLT (ELECTROSURGICAL) ×1 IMPLANT
EXTRACTOR VACUUM BELL STYLE (SUCTIONS) IMPLANT
GLOVE BIOGEL PI IND STRL 7.0 (GLOVE) ×1 IMPLANT
GLOVE BIOGEL PI IND STRL 8 (GLOVE) ×1 IMPLANT
GLOVE BIOGEL PI INDICATOR 7.0 (GLOVE) ×2
GLOVE BIOGEL PI INDICATOR 8 (GLOVE) ×2
GLOVE ECLIPSE 8.0 STRL XLNG CF (GLOVE) ×3 IMPLANT
GOWN STRL REUS W/TWL LRG LVL3 (GOWN DISPOSABLE) ×6 IMPLANT
KIT ABG SYR 3ML LUER SLIP (SYRINGE) ×3 IMPLANT
NEEDLE HYPO 18GX1.5 BLUNT FILL (NEEDLE) ×3 IMPLANT
NEEDLE HYPO 22GX1.5 SAFETY (NEEDLE) ×3 IMPLANT
NEEDLE HYPO 25X5/8 SAFETYGLIDE (NEEDLE) ×3 IMPLANT
NS IRRIG 1000ML POUR BTL (IV SOLUTION) ×3 IMPLANT
PACK C SECTION WH (CUSTOM PROCEDURE TRAY) ×3 IMPLANT
PAD OB MATERNITY 4.3X12.25 (PERSONAL CARE ITEMS) ×3 IMPLANT
PENCIL SMOKE EVAC W/HOLSTER (ELECTROSURGICAL) ×3 IMPLANT
RTRCTR C-SECT PINK 25CM LRG (MISCELLANEOUS) IMPLANT
SUT CHROMIC 0 CT 1 (SUTURE) ×3 IMPLANT
SUT MNCRL 0 VIOLET CTX 36 (SUTURE) ×2 IMPLANT
SUT MONOCRYL 0 CTX 36 (SUTURE) ×4
SUT PLAIN 2 0 (SUTURE)
SUT PLAIN 2 0 XLH (SUTURE) IMPLANT
SUT PLAIN ABS 2-0 CT1 27XMFL (SUTURE) IMPLANT
SUT VIC AB 0 CTX 36 (SUTURE) ×2
SUT VIC AB 0 CTX36XBRD ANBCTRL (SUTURE) ×1 IMPLANT
SUT VIC AB 4-0 KS 27 (SUTURE) IMPLANT
SYR 20CC LL (SYRINGE) ×6 IMPLANT
TOWEL OR 17X24 6PK STRL BLUE (TOWEL DISPOSABLE) ×3 IMPLANT
TRAY FOLEY W/BAG SLVR 14FR LF (SET/KITS/TRAYS/PACK) IMPLANT
WATER STERILE IRR 1000ML POUR (IV SOLUTION) ×3 IMPLANT

## 2019-11-29 NOTE — Op Note (Signed)
Cesarean Section Procedure Note   SHAVONNE AMBROISE  11/29/2019  Indications: Placenta Previa   Pre-operative Diagnosis: complete previa.   Post-operative Diagnosis: Same   Surgeon: Surgeon(s) and Role:    * Eure, Mertie Clause, MD - Primary     * Barrington Ellison, MD - Fellow  Assistants: Sharene Butters, MD  Anesthesia: spinal    Estimated Blood Loss: 795 mL   Total IV Fluids: 1600 mL  Urine Output: 125CC OF clear urine  Specimens: Placenta sent to pathology  Findings: Complete placenta previa posterior to anterior, viable female infant in cephalic presentation, clear amniotic fluid, normal uterus, fallopian tubes and ovaries bilaterally, placenta present posteriorly and anteriorly completely covering os  Baby condition / location:  NICU  Weight: 2610 g  APGAR (1 MIN): 6   APGAR (5 MINS): 7   APGAR (10 MINS):    Complications: no complications  Indications: Joy Hicks is a 23 y.o. G1P0000 with an IUP [redacted]w[redacted]d presenting with complete placenta previa.  The risks, benefits, complications, treatment options, and expected outcomes were discussed with the patient . The patient concurred with the proposed plan, giving informed consent. identified as MICAILA [id number] and the procedure verified as C-Section Delivery.  Procedure Details: A Time Out was held and the above information confirmed.  The patient was taken back to the operative suite where spinal anesthesia was placed.  After induction of anesthesia, the patient was draped and prepped in the usual sterile manner and placed in a dorsal supine position with a leftward tilt. A transverse incision was made and carried down through the subcutaneous tissue to the fascia. Fascial incision was made and extended transversely. The fascia was separated from the underlying rectus tissue superiorly and inferiorly. The peritoneum was identified and entered. Peritoneal incision was extended longitudinally. A low transverse uterine incision was made.  Delivered from cephalic presentation was a 2610 gram Female with Apgar scores of 6 at one minute and 7 at five minutes. Cord ph was sent the umbilical cord was clamped and cut cord blood was obtained for evaluation. Cord arterial pH 7.298. The placenta was removed Intact and appeared normal. Due to poor tone, Methergine and TXA given. The uterine outline, tubes and ovaries appeared normal. The uterine incision was closed with running locked sutures of 0 Monocryl. Hemostasis was observed. Lavage was carried out until clear. The fascia was then reapproximated with running sutures of 0 Vicryl. The subcuticular closure was performed using 2-0 plain gut. The skin was closed with 4-0 Vicryl.   Instrument, sponge, and needle counts were correct prior the abdominal closure and were correct at the conclusion of the case.   Disposition: PACU - hemodynamically stable. Of note, called while patient was in PACU as hives and rash noted on abdomen, chest, neck and face. Patient with clear breath sound bilaterally and denied difficulty breathing or tongue swelling. 50 mg IV Benadryl and 125 mg IV Solumedrol given. Will monitor closely.  Maternal Condition: stable   Signed: Chauncey Mann, MD 11/29/2019 11:21 AM

## 2019-11-29 NOTE — Transfer of Care (Signed)
Immediate Anesthesia Transfer of Care Note  Patient: Joy Hicks  Procedure(s) Performed: CESAREAN SECTION (N/A )  Patient Location: PACU  Anesthesia Type:Spinal  Level of Consciousness: awake, alert  and patient cooperative  Airway & Oxygen Therapy: Patient Spontanous Breathing  Post-op Assessment: Report given to RN and Post -op Vital signs reviewed and stable  Post vital signs: Reviewed and stable  Last Vitals:  Vitals Value Taken Time  BP 100/83 11/29/19 1043  Temp    Pulse 97 11/29/19 1052  Resp 18 11/29/19 1052  SpO2 100 % 11/29/19 1052  Vitals shown include unvalidated device data.  Last Pain:  Vitals:   11/29/19 0831  TempSrc: Oral         Complications: adverse drug reaction

## 2019-11-29 NOTE — Lactation Note (Signed)
This note was copied from a baby's chart. Lactation Consultation Note  Patient Name: Joy Hicks [id number] Date: 11/29/2019 Reason for consult: Early term 37-38.6wks;Primapara;1st time breastfeeding;Infant < 6lbs  P1 mother whose infant is now 35 hours old.  This is an ETI at 37+2 weeks weighing <6 lbs.  Mother had a Cesarean section due to placenta previa.  Baby was initially transferred to the NICU for respiratory support and was on CPAP.  His respiratory issues resolved and he was reunited with mother late this afternoon.  Mother was holding baby STS when I arrived.  The RN had just recently attempted to help mother latch, however, he was too sleepy.  Discussed the LPTI policy with parents and offered to initiate the DEBP to help stimulate breasts and increase milk supply for supplementation.  Mother willing to pump.  Pump was already "set up" at bedside.  Parts reviewed but mother will need further review with assembly.  Cleaning discussed.  Mother's breasts are soft and non tender and nipples are short shafted, everted and intact.  #24 flange size is appropriate at this time.  Observed mother pumping while reviewing basic breast feeding concepts.  Mother is familiar with hand expression and I encouraged her to practice this before/after pumping to help increase milk supply.  Colostrum container provided and milk storage times reviewed.  Finger feeding demonstrated.  Encouraged lots of STS while awake.  Demonstrated breast compressions during pumping and discussed using a "hands free" bra.  Also informed mother on how she can make a "hands free" bra since she does not have one at home.  Reviewed supplementation guidelines to be used as needed.  Parents very receptive to learning and mother eager to pump.  She will call for any questions/concerns she may have.  Mom made aware of O/P services, breastfeeding support groups, community resources, and our phone # for post-discharge questions. Mother  has a DEBP for home use.  Father supportive.  RN updated.    Maternal Data Formula Feeding for Exclusion: No Has patient been taught Hand Expression?: Yes Does the patient have breastfeeding experience prior to this delivery?: No  Feeding Feeding Type: Breast Fed  LATCH Score Latch: Too sleepy or reluctant, no latch achieved, no sucking elicited.  Audible Swallowing: None  Type of Nipple: Everted at rest and after stimulation  Comfort (Breast/Nipple): Soft / non-tender  Hold (Positioning): Assistance needed to correctly position infant at breast and maintain latch.  LATCH Score: 5  Interventions    Lactation Tools Discussed/Used Tools: Pump Breast pump type: Double-Electric Breast Pump Pump Review: Setup, frequency, and cleaning;Milk Storage Initiated by:: Francesca Strome Date initiated:: 11/30/19   Consult Status Consult Status: Follow-up Date: 11/30/19 Follow-up type: In-patient    [name] 11/29/2019, 10:35 PM

## 2019-11-29 NOTE — H&P (Signed)
Joy Hicks is a 23 y.o. female G1P0000 Estimated Date of Delivery: 12/18/19 [redacted]w[redacted]d  presenting for primary Caesarean section due to a complete posterior previa, no bleeding Pregnancy otherwise uncomplicated OB History    Gravida  1   Para  0   Term  0   Preterm  0   AB  0   Living  0     SAB  0   TAB  0   Ectopic  0   Multiple  0   Live Births  0          Past Medical History:  Diagnosis Date  . Eczema   . Medical history non-contributory    Past Surgical History:  Procedure Laterality Date  . MOUTH SURGERY    . NO PAST SURGERIES     Family History: family history includes Congestive Heart Failure in her maternal grandmother; Diabetes in her maternal grandfather; High Cholesterol in her father and mother; Hypertension in her father and mother. Social History:  reports that she has never smoked. She has never used smokeless tobacco. She reports that she does not drink alcohol or use drugs.     Maternal Diabetes: No Genetic Screening: Normal Maternal Ultrasounds/Referrals: Normal Fetal Ultrasounds or other Referrals:  None Maternal Substance Abuse:  No Significant Maternal Medications:  None Significant Maternal Lab Results:  None Other Comments:  None  ROS   Review of Systems  Constitutional: Negative for fever, chills, weight loss, malaise/fatigue and diaphoresis.  HENT: Negative for hearing loss, ear pain, nosebleeds, congestion, sore throat, neck pain, tinnitus and ear discharge.   Eyes: Negative for blurred vision, double vision, photophobia, pain, discharge and redness.  Respiratory: Negative for cough, hemoptysis, sputum production, shortness of breath, wheezing and stridor.   Cardiovascular: Negative for chest pain, palpitations, orthopnea, claudication, leg swelling and PND.  Gastrointestinal: negative for abdominal pain. Negative for heartburn, nausea, vomiting, diarrhea, constipation, blood in stool and melena.  Genitourinary: Negative for  dysuria, urgency, frequency, hematuria and flank pain.  Musculoskeletal: Negative for myalgias, back pain, joint pain and falls.  Skin: Negative for itching and rash.  Neurological: Negative for dizziness, tingling, tremors, sensory change, speech change, focal weakness, seizures, loss of consciousness, weakness and headaches.  Endo/Heme/Allergies: Negative for environmental allergies and polydipsia. Does not bruise/bleed easily.  Psychiatric/Behavioral: Negative for depression, suicidal ideas, hallucinations, memory loss and substance abuse. The patient is not nervous/anxious and does not have insomnia.      History  Past Medical History:  Diagnosis Date  . Eczema   . Medical history non-contributory     Past Surgical History:  Procedure Laterality Date  . MOUTH SURGERY    . NO PAST SURGERIES      OB History    Gravida  1   Para  0   Term  0   Preterm  0   AB  0   Living  0     SAB  0   TAB  0   Ectopic  0   Multiple  0   Live Births  0           Allergies  Allergen Reactions  . Nickel Rash    Social History   Socioeconomic History  . Marital status: Married    Spouse name: ryan Maglione  . Number of children: 0  . Years of education: 55  . Highest education level: Bachelor's degree (e.g., BA, AB, BS)  Occupational History  . Not on file  Social Needs  . Financial resource strain: Not hard at all  . Food insecurity    Worry: Never true    Inability: Never true  . Transportation needs    Medical: No    Non-medical: No  Tobacco Use  . Smoking status: Never Smoker  . Smokeless tobacco: Never Used  Substance and Sexual Activity  . Alcohol use: No  . Drug use: No  . Sexual activity: Yes    Birth control/protection: None  Lifestyle  . Physical activity    Days per week: 0 days    Minutes per session: 0 min  . Stress: Not at all  Relationships  . Social connections    Talks on phone: More than three times a week    Gets together: More  than three times a week    Attends religious service: Never    Active member of club or organization: No    Attends meetings of clubs or organizations: Never    Relationship status: Married  Other Topics Concern  . Not on file  Social History Narrative  . Not on file    Family History  Problem Relation Age of Onset  . Congestive Heart Failure Maternal Grandmother   . Diabetes Maternal Grandfather   . Hypertension Father   . High Cholesterol Father   . Hypertension Mother   . High Cholesterol Mother       Blood pressure 131/81, temperature 98.1 F (36.7 C), temperature source Oral, resp. rate 18, height 5\' 2"  (1.575 m), weight 67.1 kg, last menstrual period 03/05/2019, SpO2 100 %. Exam Physical Exam  Physical Exam  Vitals reviewed. Constitutional: She is oriented to person, place, and time. She appears well-developed and well-nourished.  HENT:  Head: Normocephalic and atraumatic.  Right Ear: External ear normal.  Left Ear: External ear normal.  Nose: Nose normal.  Mouth/Throat: Oropharynx is clear and moist.  Eyes: Conjunctivae and EOM are normal. Pupils are equal, round, and reactive to light. Right eye exhibits no discharge. Left eye exhibits no discharge. No scleral icterus.  Neck: Normal range of motion. Neck supple. No tracheal deviation present. No thyromegaly present.  Cardiovascular: Normal rate, regular rhythm, normal heart sounds and intact distal pulses.  Exam reveals no gallop and no friction rub.   No murmur heard. Respiratory: Effort normal and breath sounds normal. No respiratory distress. She has no wheezes. She has no rales. She exhibits no tenderness.  GI: Soft. Bowel sounds are normal. She exhibits no distension and no mass. There is tenderness. There is no rebound and no guarding.  Genitourinary:       Vulva is normal without lesions Vagina is pink moist without discharge Cervix normal in appearance and pap is normal Uterus is size consistent with 37  weeks Adnexa is negative with normal sized ovaries by sonogram  Musculoskeletal: Normal range of motion. She exhibits no edema and no tenderness.  Neurological: She is alert and oriented to person, place, and time. She has normal reflexes. She displays normal reflexes. No cranial nerve deficit. She exhibits normal muscle tone. Coordination normal.  Skin: Skin is warm and dry. No rash noted. No erythema. No pallor.  Psychiatric: She has a normal mood and affect. Her behavior is normal. Judgment and thought content normal.    Prenatal labs: ABO, Rh: --/--/O POS, O POS Performed at [facility], [address], [place], [place]  [phone]) Antibody: NEG (12/06 0853) Rubella: 1.60 (07/14 1341) RPR: NON REACTIVE (12/06  [id number])  HBsAg: Negative (07/14 1341)  HIV: Non Reactive (09/29 0900)  GBS: --Theda Sers/Negative (12/01 1406)   Assessment/Plan: 5725w2d Estimated Date of Delivery: 12/18/19  Complete previa, posterior  Primary Caesarean section   Pt understands the risks of surgery including but not limited t  excessive bleeding requiring transfusion or reoperation, post-operative infection requiring prolonged hospitalization or re-hospitalization and antibiotic therapy, and damage to other organs including bladder, bowel, ureters and major vessels.  The patient also understands the alternative treatment options which were discussed in full.  All questions were answered.  Lazaro ArmsLuther H Eure 11/29/2019 9:17 AM    Lazaro ArmsLuther H Eure 11/29/2019, 9:13 AM

## 2019-11-29 NOTE — Anesthesia Postprocedure Evaluation (Signed)
Anesthesia Post Note  Patient: Joy Hicks  Procedure(s) Performed: CESAREAN SECTION (N/A )     Anesthesia Post Evaluation  Last Vitals:  Vitals:   11/29/19 1145 11/29/19 1154  BP: 92/64 95/61  Pulse: 73 86  Resp: 13 18  Temp: 37.1 C   SpO2: 98% 100%    Last Pain:  Vitals:   11/29/19 1200  TempSrc:   PainSc: 0-No pain   Pain Goal:                   Davion Flannery

## 2019-11-29 NOTE — Discharge Summary (Signed)
  Postpartum Discharge Summary     Patient Name: Joy Hicks DOB: 04/08/1996 MRN: [id number]  Date of admission: 11/29/2019 Delivering Provider: EURE, LUTHER H   Date of discharge: 12/01/2019  Admitting diagnosis: complete previa Intrauterine pregnancy: [redacted]w[redacted]d     Secondary diagnosis:  Active Problems:   Placenta previa   Gestational diabetes mellitus, class A1   [redacted] weeks gestation of pregnancy   Allergic reaction   Postoperative anemia  Additional problems: None     Discharge diagnosis: Term Pregnancy Delivered, GDM A1 and Anemia                                                                                                Post partum procedures:None  Augmentation: NA  Complications: None  Hospital course:  Scheduled C/S   23 y.o. yo G1P0000 at [redacted]w[redacted]d was admitted to the hospital 11/29/2019 for scheduled cesarean section with the following indication:Previa.  Membrane Rupture Time/Date: 9:56 AM ,11/29/2019   Patient delivered a Viable infant.11/29/2019  Details of operation can be found in separate operative note.  Pateint had an uncomplicated postpartum course. Of note, patient with allergic reaction involving hives and rash right after c-section; IV Benadryl and Solumedrol given with no further complications. Fasting AM glucose 77. She is ambulating, tolerating a regular diet, passing flatus, and urinating well. Her Hgb on POD#1 was 8.6 and had been 12.4 preop- started on po iron. Patient is discharged home in stable condition on 12/01/19.        Delivery time: 9:56 AM    Magnesium Sulfate received: No BMZ received: No Rhophylac:N/A MMR:N/A Transfusion:No  Physical exam  Vitals:   11/30/19 0800 11/30/19 1420 12/01/19 0100 12/01/19 0508  BP: 110/76 115/63 (!) 107/57 (!) 96/53  Pulse: 61 67 63 69  Resp: 20 19 16 16  Temp: 98.2 F (36.8 C) 98 F (36.7 C) 98 F (36.7 C) 97.9 F (36.6 C)  TempSrc: Oral Oral Oral Axillary  SpO2: 100% 99%    Weight:      Height:        General: alert and cooperative Lochia: appropriate Uterine Fundus: firm Incision: honeycomb dry and intact DVT Evaluation: No evidence of DVT seen on physical exam. Labs: Lab Results  Component Value Date   WBC 10.4 11/30/2019   HGB 8.6 (L) 11/30/2019   HCT 24.6 (L) 11/30/2019   MCV 88.5 11/30/2019   PLT 147 (L) 11/30/2019   CMP Latest Ref Rng & Units 11/30/2019  Creatinine 0.44 - 1.00 mg/dL 0.45    Discharge instruction: per After Visit Summary and "Baby and Me Booklet".  After visit meds:  Allergies as of 12/01/2019      Reactions   Ancef [cefazolin] Hives, Shortness Of Breath, Rash   Nickel Rash      Medication List    STOP taking these medications   Blood Pressure Monitor Misc   Desoximetasone 0.05 % Oint   fluconazole 150 MG tablet Commonly known as: Diflucan   glucose blood test strip   onetouch ultrasoft lancets     TAKE these medications   ferrous sulfate 325 (  65 FE) MG tablet Take 1 tablet (325 mg total) by mouth daily with breakfast.   ibuprofen 800 MG tablet Commonly known as: ADVIL Take 1 tablet (800 mg total) by mouth every 8 (eight) hours as needed.   oxyCODONE 5 MG immediate release tablet Commonly known as: Oxy IR/ROXICODONE Take 1-2 tablets (5-10 mg total) by mouth every 4 (four) hours as needed for moderate pain.   PrePLUS 27-1 MG Tabs Take 1 tablet by mouth daily.       Diet: routine diet  Activity: Advance as tolerated. Pelvic rest for 6 weeks.   Outpatient follow up:4 weeks Follow up Appt: Future Appointments  Date Time Provider [place]  12/06/2019 11:30 AM Florian Buff, MD [facility]   Follow up Visit:   Please schedule this patient for Postpartum visit in: 4 weeks with the following provider: Any provider For C/S patients schedule nurse incision check in weeks 2 weeks: yes High risk pregnancy complicated by: placenta previa Delivery mode:  CS Anticipated Birth Control:  POPs PP Procedures needed:  GTT and incision check  Schedule Integrated BH visit: no    Newborn Data: Live born female  Birth Weight: 5 lb 12.1 oz (2610 g) APGAR: 6, 7  Newborn Delivery   Birth date/time: 11/29/2019 09:56:00 Delivery type: C-Section, Low Transverse Trial of labor: No C-section categorization: Primary      Baby Feeding: Breast Disposition:home with mother   12/01/2019 Myrtis Ser, CNM  7:31 AM

## 2019-11-29 NOTE — Anesthesia Procedure Notes (Signed)
Spinal  Patient location during procedure: OR Start time: 11/29/2019 9:37 AM Staffing Anesthesiologist: Janeece Riggers, MD Other anesthesia staff: Georgina Peer, RN Performed: anesthesiologist and other anesthesia staff  Preanesthetic Checklist Completed: patient identified, site marked, surgical consent, pre-op evaluation, timeout performed, IV checked, risks and benefits discussed and monitors and equipment checked Spinal Block Patient position: sitting Prep: DuraPrep Patient monitoring: heart rate, cardiac monitor, continuous pulse ox and blood pressure Approach: midline Location: L3-4 Injection technique: single-shot Needle Needle type: Sprotte  Needle gauge: 24 G Needle length: 9 cm Assessment Sensory level: T4

## 2019-11-29 NOTE — Anesthesia Preprocedure Evaluation (Signed)
Anesthesia Evaluation  Patient identified by MRN, date of birth, ID band Patient awake    Reviewed: Allergy & Precautions, H&P , NPO status , Patient's Chart, lab work & pertinent test results, reviewed documented beta blocker date and time   Airway Mallampati: I  TM Distance: >3 FB Neck ROM: full    Dental no notable dental hx. (+) Teeth Intact, Dental Advisory Given   Pulmonary neg pulmonary ROS,    Pulmonary exam normal breath sounds clear to auscultation       Cardiovascular negative cardio ROS Normal cardiovascular exam Rhythm:regular Rate:Normal     Neuro/Psych negative neurological ROS  negative psych ROS   GI/Hepatic negative GI ROS, Neg liver ROS,   Endo/Other  diabetes, Gestational  Renal/GU negative Renal ROS  negative genitourinary   Musculoskeletal   Abdominal   Peds  Hematology negative hematology ROS (+)   Anesthesia Other Findings   Reproductive/Obstetrics (+) Pregnancy                             Anesthesia Physical Anesthesia Plan  ASA: II  Anesthesia Plan: Spinal   Post-op Pain Management:    Induction:   PONV Risk Score and Plan:   Airway Management Planned:   Additional Equipment:   Intra-op Plan:   Post-operative Plan:   Informed Consent: I have reviewed the patients History and Physical, chart, labs and discussed the procedure including the risks, benefits and alternatives for the proposed anesthesia with the patient or authorized representative who has indicated his/her understanding and acceptance.     Dental Advisory Given  Plan Discussed with: Anesthesiologist, CRNA and Surgeon  Anesthesia Plan Comments: (  )        Anesthesia Quick Evaluation

## 2019-11-30 ENCOUNTER — Encounter (HOSPITAL_COMMUNITY): Payer: Self-pay | Admitting: *Deleted

## 2019-11-30 LAB — CBC
HCT: 24.6 % — ABNORMAL LOW (ref 36.0–46.0)
Hemoglobin: 8.6 g/dL — ABNORMAL LOW (ref 12.0–15.0)
MCH: 30.9 pg (ref 26.0–34.0)
MCHC: 35 g/dL (ref 30.0–36.0)
MCV: 88.5 fL (ref 80.0–100.0)
Platelets: 147 10*3/uL — ABNORMAL LOW (ref 150–400)
RBC: 2.78 MIL/uL — ABNORMAL LOW (ref 3.87–5.11)
RDW: 12.4 % (ref 11.5–15.5)
WBC: 10.4 10*3/uL (ref 4.0–10.5)
nRBC: 0 % (ref 0.0–0.2)

## 2019-11-30 LAB — CREATININE, SERUM
Creatinine, Ser: 0.45 mg/dL (ref 0.44–1.00)
GFR calc Af Amer: >60 mL/min (ref >60)
GFR calc non Af Amer: >60 mL/min (ref >60)

## 2019-11-30 MED ORDER — FERROUS SULFATE 325 (65 FE) MG PO TABS
325.0000 mg | ORAL_TABLET | Freq: Two times a day (BID) | ORAL | Status: DC
  Administered 2019-11-30 – 2019-12-01 (×2): 325 mg via ORAL
  Filled 2019-11-30 (×2): qty 1

## 2019-11-30 NOTE — Lactation Note (Signed)
This note was copied from a baby's chart. Lactation Consultation Note  Patient Name: Joy Hicks [id number] Date: 11/30/2019 Reason for consult: Difficult latch;Early term 37-38.6wks;Infant < 6lbs;Primapara Follow made to offer feeding assist.  Mom states she feels to overwhelmed with breastfeeding and has decided to formula feed.  Discussed the option of pumping and bottle feeding and she is interested in this.  Instructed to pump every 3 hours x 15 minutes.  Support given.  Encouraged to call for assist prn.  Maternal Data    Feeding Feeding Type: (encouraged to put skin to skin)  LATCH Score Latch: Repeated attempts needed to sustain latch, nipple held in mouth throughout feeding, stimulation needed to elicit sucking reflex.  Audible Swallowing: A few with stimulation  Type of Nipple: Everted at rest and after stimulation  Comfort (Breast/Nipple): Soft / non-tender  Hold (Positioning): Assistance needed to correctly position infant at breast and maintain latch.  LATCH Score: 7  Interventions Interventions: Adjust position;DEBP;Assisted with latch;Support pillows;Skin to skin;Breast massage;Hand express  Lactation Tools Discussed/Used Tools: Nipple Shields Nipple shield size: 20   Consult Status Consult Status: Follow-up Date: 12/01/19 Follow-up type: In-patient    Ave Filter 11/30/2019, 2:16 PM

## 2019-11-30 NOTE — Progress Notes (Signed)
Subjective: Postpartum Day 1: Cesarean Delivery Patient reports incisional pain and tolerating PO.  Tolerated being out of bed well,  Had some dizziness the first time but not the second time  Objective: Vital signs in last 24 hours: Temp:  [97.6 F (36.4 C)-98.9 F (37.2 C)] 98.6 F (37 C) (12/09 0402) Pulse Rate:  [58-94] 58 (12/09 0402) Resp:  [10-18] 16 (12/09 0402) BP: (90-131)/(60-83) 109/72 (12/09 0402) SpO2:  [98 %-100 %] 100 % (12/09 0402) Weight:  [67.1 kg] 67.1 kg (12/08 0831)  Physical Exam:  General: alert, cooperative and no distress Lochia: appropriate Uterine Fundus: firm Incision: healing well, no significant drainage DVT Evaluation: No evidence of DVT seen on physical exam.  SCDs on.  Recent Labs    11/27/19 0853  HGB 12.4  HCT 36.3   AM CBC is pending  Assessment/Plan: Status post Cesarean section. Doing well postoperatively.  Continue current care.  Joy Hicks 11/30/2019, 5:26 AM

## 2019-11-30 NOTE — Lactation Note (Signed)
This note was copied from a baby's chart. Lactation Consultation Note  Patient Name: Joy Hicks [id number] Date: 11/30/2019 Reason for consult: Difficult latch;Early term 37-38.6wks;Infant < 6lbs;Primapara Baby is 25 hours old/3% weight loss.  Baby has been sleepy with mostly attempts at breast.  Mom is pumping and hand expressing colostrum.  Baby has been spoon fed.  Baby is currently sleeping.  Waking techniques done.  Positioned skin to skin in football hold.  Colostrum easily hand expressed into baby's mouth.  Several attempts made to latch but baby not opening or showing interest.  Allowed baby to suck on gloved finger.  He kept tongue on roof of his mouth.  Baby started opening better but unable to grasp tissue.  20 mm nipple shield applied.  Baby latched and fed off and on for 10 minutes but pulled off frequently.  No colostrum in the shield.  Hand expression done and 5 mls easily obtained.  Baby spoon fed and tolerated well.  Instructed to attempt to latch with nipple shield, post pump and hand express and spoon feed expressed milk, call for assist prn.  Maternal Data    Feeding Feeding Type: Breast Fed  LATCH Score Latch: Repeated attempts needed to sustain latch, nipple held in mouth throughout feeding, stimulation needed to elicit sucking reflex.  Audible Swallowing: A few with stimulation  Type of Nipple: Everted at rest and after stimulation  Comfort (Breast/Nipple): Soft / non-tender  Hold (Positioning): Assistance needed to correctly position infant at breast and maintain latch.  LATCH Score: 7  Interventions Interventions: Adjust position;DEBP;Assisted with latch;Support pillows;Skin to skin;Breast massage;Hand express  Lactation Tools Discussed/Used Tools: Nipple Shields Nipple shield size: 20   Consult Status Consult Status: Follow-up Date: 12/01/19 Follow-up type: In-patient    Ave Filter 11/30/2019, 11:11 AM

## 2019-12-01 DIAGNOSIS — D649 Anemia, unspecified: Secondary | ICD-10-CM | POA: Diagnosis not present

## 2019-12-01 LAB — SURGICAL PATHOLOGY

## 2019-12-01 LAB — GLUCOSE, CAPILLARY: Glucose-Capillary: 77 mg/dL (ref 70–99)

## 2019-12-01 MED ORDER — OXYCODONE HCL 5 MG PO TABS: 5.0000 mg | ORAL_TABLET | ORAL | 0 refills | Status: DC | PRN

## 2019-12-01 MED ORDER — IBUPROFEN 800 MG PO TABS: 800.0000 mg | ORAL_TABLET | Freq: Three times a day (TID) | ORAL | 0 refills | Status: DC | PRN

## 2019-12-01 MED ORDER — FERROUS SULFATE 325 (65 FE) MG PO TABS: 325.0000 mg | ORAL_TABLET | Freq: Every day | ORAL | 3 refills | Status: DC

## 2019-12-01 NOTE — Discharge Instructions (Signed)

## 2019-12-05 ENCOUNTER — Telehealth: Payer: Self-pay | Admitting: *Deleted

## 2019-12-05 NOTE — Telephone Encounter (Signed)
Spoke to patient and advised she can take a shower and remove the dressing.  Advised to pat steri strips dry.  Pt verbalized understanding with no further questions.

## 2019-12-05 NOTE — Telephone Encounter (Signed)
Patient left message that she has an appt tomorrow with Dr Elonda Husky for an incision checks and wants to know if she can remove dressing before then.

## 2019-12-06 ENCOUNTER — Ambulatory Visit (INDEPENDENT_AMBULATORY_CARE_PROVIDER_SITE_OTHER): Payer: 59 | Admitting: Obstetrics & Gynecology

## 2019-12-06 ENCOUNTER — Other Ambulatory Visit: Payer: Self-pay

## 2019-12-06 ENCOUNTER — Encounter: Payer: Self-pay | Admitting: Obstetrics & Gynecology

## 2019-12-06 VITALS — BP 111/70 | HR 68 | Ht 62.0 in | Wt 138.0 lb

## 2019-12-06 DIAGNOSIS — Z98891 History of uterine scar from previous surgery: Secondary | ICD-10-CM

## 2019-12-06 DIAGNOSIS — Z9889 Other specified postprocedural states: Secondary | ICD-10-CM

## 2019-12-06 NOTE — Progress Notes (Signed)
  HPI: Patient returns for routine postoperative follow-up having undergone primary C section on 11/30/2019.  The patient's immediate postoperative recovery has been unremarkable. Since hospital discharge the patient reports no rpbolems.   Current Outpatient Medications: .  ferrous sulfate 325 (65 FE) MG tablet, Take 1 tablet (325 mg total) by mouth daily with breakfast., Disp: 30 tablet, Rfl: 3 .  ibuprofen (ADVIL) 800 MG tablet, Take 1 tablet (800 mg total) by mouth every 8 (eight) hours as needed., Disp: 30 tablet, Rfl: 0 .  Prenatal Vit-Fe Fumarate-FA (PREPLUS) 27-1 MG TABS, Take 1 tablet by mouth daily., Disp: 30 tablet, Rfl: 13 .  oxyCODONE (OXY IR/ROXICODONE) 5 MG immediate release tablet, Take 1-2 tablets (5-10 mg total) by mouth every 4 (four) hours as needed for moderate pain. (Patient not taking: Reported on 12/06/2019), Disp: 30 tablet, Rfl: 0  No current facility-administered medications for this visit.    Blood pressure 111/70, pulse 68, height 5\' 2"  (1.575 m), weight 138 lb (62.6 kg), unknown if currently breastfeeding.  Physical Exam: Incision clean dry intact Abdomen is soft non tender normal post op  Diagnostic Tests:   Pathology: benign  Impression: S/p primary C section due to placental previa  Plan: Considering COC, not breast feeding  Follow up: 5  weeks  Florian Buff, MD

## 2019-12-13 ENCOUNTER — Other Ambulatory Visit: Payer: Self-pay | Admitting: *Deleted

## 2019-12-13 DIAGNOSIS — O24419 Gestational diabetes mellitus in pregnancy, unspecified control: Secondary | ICD-10-CM

## 2019-12-21 ENCOUNTER — Ambulatory Visit: Payer: 59

## 2020-01-10 ENCOUNTER — Ambulatory Visit: Payer: 59 | Admitting: Women's Health

## 2020-01-12 ENCOUNTER — Ambulatory Visit (INDEPENDENT_AMBULATORY_CARE_PROVIDER_SITE_OTHER): Payer: 59 | Admitting: Advanced Practice Midwife

## 2020-01-12 ENCOUNTER — Other Ambulatory Visit: Payer: Self-pay

## 2020-01-12 ENCOUNTER — Encounter: Payer: Self-pay | Admitting: Advanced Practice Midwife

## 2020-01-12 DIAGNOSIS — Z1389 Encounter for screening for other disorder: Secondary | ICD-10-CM

## 2020-01-12 DIAGNOSIS — Z3009 Encounter for other general counseling and advice on contraception: Secondary | ICD-10-CM

## 2020-01-12 NOTE — Patient Instructions (Signed)
Preventive Care 21-24 Years Old, Female Preventive care refers to visits with your health care provider and lifestyle choices that can promote health and wellness. This includes:  A yearly physical exam. This may also be called an annual well check.  Regular dental visits and eye exams.  Immunizations.  Screening for certain conditions.  Healthy lifestyle choices, such as eating a healthy diet, getting regular exercise, not using drugs or products that contain nicotine and tobacco, and limiting alcohol use. What can I expect for my preventive care visit? Physical exam Your health care provider will check your:  Height and weight. This may be used to calculate body mass index (BMI), which tells if you are at a healthy weight.  Heart rate and blood pressure.  Skin for abnormal spots. Counseling Your health care provider may ask you questions about your:  Alcohol, tobacco, and drug use.  Emotional well-being.  Home and relationship well-being.  Sexual activity.  Eating habits.  Work and work environment.  Method of birth control.  Menstrual cycle.  Pregnancy history. What immunizations do I need?  Influenza (flu) vaccine  This is recommended every year. Tetanus, diphtheria, and pertussis (Tdap) vaccine  You may need a Td booster every 10 years. Varicella (chickenpox) vaccine  You may need this if you have not been vaccinated. Human papillomavirus (HPV) vaccine  If recommended by your health care provider, you may need three doses over 6 months. Measles, mumps, and rubella (MMR) vaccine  You may need at least one dose of MMR. You may also need a second dose. Meningococcal conjugate (MenACWY) vaccine  One dose is recommended if you are age 19-21 years and a first-year college student living in a residence hall, or if you have one of several medical conditions. You may also need additional booster doses. Pneumococcal conjugate (PCV13) vaccine  You may need  this if you have certain conditions and were not previously vaccinated. Pneumococcal polysaccharide (PPSV23) vaccine  You may need one or two doses if you smoke cigarettes or if you have certain conditions. Hepatitis A vaccine  You may need this if you have certain conditions or if you travel or work in places where you may be exposed to hepatitis A. Hepatitis B vaccine  You may need this if you have certain conditions or if you travel or work in places where you may be exposed to hepatitis B. Haemophilus influenzae type b (Hib) vaccine  You may need this if you have certain conditions. You may receive vaccines as individual doses or as more than one vaccine together in one shot (combination vaccines). Talk with your health care provider about the risks and benefits of combination vaccines. What tests do I need?  Blood tests  Lipid and cholesterol levels. These may be checked every 5 years starting at age 20.  Hepatitis C test.  Hepatitis B test. Screening  Diabetes screening. This is done by checking your blood sugar (glucose) after you have not eaten for a while (fasting).  Sexually transmitted disease (STD) testing.  BRCA-related cancer screening. This may be done if you have a family history of breast, ovarian, tubal, or peritoneal cancers.  Pelvic exam and Pap test. This may be done every 3 years starting at age 21. Starting at age 30, this may be done every 5 years if you have a Pap test in combination with an HPV test. Talk with your health care provider about your test results, treatment options, and if necessary, the need for more tests.   Follow these instructions at home: Eating and drinking   Eat a diet that includes fresh fruits and vegetables, whole grains, lean protein, and low-fat dairy.  Take vitamin and mineral supplements as recommended by your health care provider.  Do not drink alcohol if: ? Your health care provider tells you not to drink. ? You are  pregnant, may be pregnant, or are planning to become pregnant.  If you drink alcohol: ? Limit how much you have to 0-1 drink a day. ? Be aware of how much alcohol is in your drink. In the [place] one drink equals one 12 oz bottle of beer (355 mL), one 5 oz glass of wine (148 mL), or one 1 oz glass of hard liquor (44 mL). Lifestyle  Take daily care of your teeth and gums.  Stay active. Exercise for at least 30 minutes on 5 or more days each week.  Do not use any products that contain nicotine or tobacco, such as cigarettes, e-cigarettes, and chewing tobacco. If you need help quitting, ask your health care provider.  If you are sexually active, practice safe sex. Use a condom or other form of birth control (contraception) in order to prevent pregnancy and STIs (sexually transmitted infections). If you plan to become pregnant, see your health care provider for a preconception visit. What's next?  Visit your health care provider once a year for a well check visit.  Ask your health care provider how often you should have your eyes and teeth checked.  Stay up to date on all vaccines. This information is not intended to replace advice given to you by your health care provider. Make sure you discuss any questions you have with your health care provider. Document Revised: 08/19/2018 Document Reviewed: 08/19/2018 Elsevier Patient Education  2020 [employer].

## 2020-01-12 NOTE — Progress Notes (Signed)
Post Partum Exam  Joy Hicks is a 24 y.o. G18P1001 female who presents for a postpartum visit. She is 6 weeks postpartum following a primary cesarean for placenta previa. I have fully reviewed the prenatal and intrapartum course. The delivery was at 37.2 gestational weeks.  Anesthesia: spinal. Postpartum course has been uncomplicated. Baby's course has been uncomplicated. Baby is feeding by bottle. Bleeding staining only. Patient believes she started her menstrual cycle on Saturday. Bowel function is normal. Bladder function is normal. Patient is not sexually active. Contraception method is oral progesterone-only contraceptive. Postpartum depression screening:neg  The following portions of the patient's history were reviewed and updated as appropriate: allergies, current medications, past family history, past medical history, past social history, past surgical history and problem list. Last pap smear done 02/10/2019 and was Normal  Review of Systems A comprehensive review of systems was negative.    Objective:  Blood pressure 123/78, pulse 84, height 5\' 3"  (1.6 m), weight 139 lb 3.2 oz (63.1 kg), not currently breastfeeding.  General:  alert, cooperative, appears stated age and no distress   Breasts:  inspection negative, no nipple discharge or bleeding, no masses or nodularity palpable  Lungs: clear to auscultation bilaterally  Heart:  regular rate and rhythm, S1, S2 normal, no murmur, click, rub or gallop  Abdomen: soft, non-tender; bowel sounds normal; no masses,  no organomegaly   Vulva:  normal  Vagina: not evaluated        Assessment:    Normal postpartum exam. Surgical incision is clean, dry, well-approximated. No drainage, bruising or streaking visualized. Pap smear not done at today's visit.   Plan:   1. Contraception: none. Previously planned to use POPs but declined rx today 2. Reviewed guidelines for slow reintroduction of pre-pregnancy activites 3. Follow up in: 1 year   or as needed.   , MSN, CNM Certified Nurse Midwife, [employer] for [facility] 01/12/20 8:34 PM

## 2020-01-13 ENCOUNTER — Telehealth: Payer: Self-pay | Admitting: *Deleted

## 2020-01-13 NOTE — Telephone Encounter (Signed)
Pt had PP visit yesterday. Says that everything was fine. Last night she started bleeding heavy. Wanted to know if this is normal.

## 2020-01-13 NOTE — Telephone Encounter (Signed)
Pt is having heavy bleeding. Started last night. Had postpartum visit yesterday. + cramping. Filling up medium size pad in 2.5 hours. This is first period since delivery. Not breastfeeding. Not on any birth control. Pt was advised that first period after delivery is usually heavy. Can take Ibuprofen for cramps. Pt reassured and voiced understanding. JSY

## 2020-09-05 ENCOUNTER — Ambulatory Visit (INDEPENDENT_AMBULATORY_CARE_PROVIDER_SITE_OTHER): Payer: 59 | Admitting: *Deleted

## 2020-09-05 ENCOUNTER — Encounter: Payer: Self-pay | Admitting: *Deleted

## 2020-09-05 VITALS — BP 113/73 | HR 70 | Ht 62.5 in | Wt 137.0 lb

## 2020-09-05 DIAGNOSIS — Z3201 Encounter for pregnancy test, result positive: Secondary | ICD-10-CM | POA: Diagnosis not present

## 2020-09-05 LAB — POCT URINE PREGNANCY: Preg Test, Ur: POSITIVE — AB

## 2020-09-05 NOTE — Progress Notes (Addendum)
   NURSE VISIT- PREGNANCY CONFIRMATION   SUBJECTIVE:  Joy Hicks is a 24 y.o. G39P1001 female at [redacted]w[redacted]d by certain LMP of Patient's last menstrual period was 07/29/2020 (exact date). Here for pregnancy confirmation.  Home pregnancy test: positive x two.  She reports no complaints.  She is taking prenatal vitamins.    OBJECTIVE:  BP 113/73 (BP Location: Left Arm, Patient Position: Sitting, Cuff Size: Normal)   Pulse 70   Ht 5' 2.5" (1.588 m)   Wt 137 lb (62.1 kg)   LMP 07/29/2020 (Exact Date)   BMI 24.66 kg/m   Appears well, in no apparent distress OB History  Gravida Para Term Preterm AB Living  2 1 1  0 0 1  SAB TAB Ectopic Multiple Live Births  0 0 0 0 1    # Outcome Date GA Lbr Len/2nd Weight Sex Delivery Anes PTL Lv  2 Current           1 Term 11/29/19 [redacted]w[redacted]d  5 lb 12.1 oz (2.61 kg) M CS-LTranv Spinal  LIV    Results for orders placed or performed in visit on 09/05/20 (from the past 24 hour(s))  POCT urine pregnancy   Collection Time: 09/05/20  3:31 PM  Result Value Ref Range   Preg Test, Ur Positive (A) Negative    ASSESSMENT: Positive pregnancy test, [redacted]w[redacted]d by LMP    PLAN: Schedule for dating ultrasound in 3 weeks. Prenatal vitamins: continue   Nausea medicines: not currently needed   OB packet given: Yes  [redacted]w[redacted]d  09/05/2020 3:32 PM   Chart reviewed for nurse visit. Agree with plan of care.  09/07/2020, Cheral Marker 09/06/2020 12:52 PM

## 2020-10-01 ENCOUNTER — Other Ambulatory Visit: Payer: Self-pay | Admitting: Obstetrics & Gynecology

## 2020-10-01 DIAGNOSIS — O3680X0 Pregnancy with inconclusive fetal viability, not applicable or unspecified: Secondary | ICD-10-CM

## 2020-10-02 ENCOUNTER — Ambulatory Visit (INDEPENDENT_AMBULATORY_CARE_PROVIDER_SITE_OTHER): Payer: 59

## 2020-10-02 DIAGNOSIS — Z3A09 9 weeks gestation of pregnancy: Secondary | ICD-10-CM | POA: Diagnosis not present

## 2020-10-02 DIAGNOSIS — O3680X0 Pregnancy with inconclusive fetal viability, not applicable or unspecified: Secondary | ICD-10-CM | POA: Diagnosis not present

## 2020-10-02 NOTE — Progress Notes (Signed)
[place] 8+1 wks,single IUP with ys,crl 17.08 mm,fhr 173 bpm,normal ovaries

## 2020-10-29 ENCOUNTER — Other Ambulatory Visit: Payer: Self-pay | Admitting: Obstetrics & Gynecology

## 2020-10-29 ENCOUNTER — Encounter: Payer: Self-pay | Admitting: Women's Health

## 2020-10-29 DIAGNOSIS — Z3682 Encounter for antenatal screening for nuchal translucency: Secondary | ICD-10-CM

## 2020-10-29 DIAGNOSIS — Z3490 Encounter for supervision of normal pregnancy, unspecified, unspecified trimester: Secondary | ICD-10-CM | POA: Insufficient documentation

## 2020-10-29 DIAGNOSIS — Z98891 History of uterine scar from previous surgery: Secondary | ICD-10-CM | POA: Insufficient documentation

## 2020-10-30 ENCOUNTER — Ambulatory Visit (INDEPENDENT_AMBULATORY_CARE_PROVIDER_SITE_OTHER): Payer: 59 | Admitting: Women's Health

## 2020-10-30 ENCOUNTER — Ambulatory Visit: Payer: 59 | Admitting: *Deleted

## 2020-10-30 ENCOUNTER — Ambulatory Visit (INDEPENDENT_AMBULATORY_CARE_PROVIDER_SITE_OTHER): Payer: 59

## 2020-10-30 ENCOUNTER — Encounter: Payer: Self-pay | Admitting: Women's Health

## 2020-10-30 VITALS — BP 97/87 | HR 85 | Wt 134.0 lb

## 2020-10-30 DIAGNOSIS — Z98891 History of uterine scar from previous surgery: Secondary | ICD-10-CM

## 2020-10-30 DIAGNOSIS — Z3A12 12 weeks gestation of pregnancy: Secondary | ICD-10-CM

## 2020-10-30 DIAGNOSIS — Z3481 Encounter for supervision of other normal pregnancy, first trimester: Secondary | ICD-10-CM

## 2020-10-30 DIAGNOSIS — O09299 Supervision of pregnancy with other poor reproductive or obstetric history, unspecified trimester: Secondary | ICD-10-CM | POA: Insufficient documentation

## 2020-10-30 DIAGNOSIS — Z331 Pregnant state, incidental: Secondary | ICD-10-CM

## 2020-10-30 DIAGNOSIS — Z3682 Encounter for antenatal screening for nuchal translucency: Secondary | ICD-10-CM

## 2020-10-30 DIAGNOSIS — Z1389 Encounter for screening for other disorder: Secondary | ICD-10-CM

## 2020-10-30 DIAGNOSIS — Z3480 Encounter for supervision of other normal pregnancy, unspecified trimester: Secondary | ICD-10-CM

## 2020-10-30 DIAGNOSIS — Z8632 Personal history of gestational diabetes: Secondary | ICD-10-CM

## 2020-10-30 LAB — POCT URINALYSIS DIPSTICK OB
Blood, UA: NEGATIVE
Glucose, UA: NEGATIVE
Ketones, UA: NEGATIVE
Leukocytes, UA: NEGATIVE
Nitrite, UA: NEGATIVE
POC,PROTEIN,UA: NEGATIVE

## 2020-10-30 NOTE — Patient Instructions (Signed)
Joy Hicks, I greatly value your feedback.  If you receive a survey following your visit with [place] today, we appreciate you taking the time to fill it out.  Thanks, Joy Hicks, CNM, WHNP-BC   [facility] ([address] [place], [place] [address]) Entrance C, located off of [address] Free 24/7 valet parking   Nausea & Vomiting  Have saltine crackers or pretzels by your bed and eat a few bites before you raise your head out of bed in the morning  Eat small frequent meals throughout the day instead of large meals  Drink plenty of fluids throughout the day to stay hydrated, just don't drink a lot of fluids with your meals.  This can make your stomach fill up faster making you feel sick  Do not brush your teeth right after you eat  Products with real ginger are good for nausea, like ginger ale and ginger hard candy Make sure it says made with real ginger!  Sucking on sour candy like lemon heads is also good for nausea  If your prenatal vitamins make you nauseated, take them at night so you will sleep through the nausea  Sea Bands  If you feel like you need medicine for the nausea & vomiting please let us know  If you are unable to keep any fluids or food down please let us know   Constipation  Drink plenty of fluid, preferably water, throughout the day  Eat foods high in fiber such as fruits, vegetables, and grains  Exercise, such as walking, is a good way to keep your bowels regular  Drink warm fluids, especially warm prune juice, or decaf coffee  Eat a 1/2 cup of real oatmeal (not instant), 1/2 cup applesauce, and 1/2-1 cup warm prune juice every day  If needed, you may take Colace (docusate sodium) stool softener once or twice a day to help keep the stool soft.   If you still are having problems with constipation, you may take Miralax once daily as needed to help keep your bowels regular.   Home Blood Pressure Monitoring for Patients    Your provider has recommended that you check your blood pressure (BP) at least once a week at home. If you do not have a blood pressure cuff at home, one will be provided for you. Contact your provider if you have not received your monitor within 1 week.   Helpful Tips for Accurate Home Blood Pressure Checks  . Don't smoke, exercise, or drink caffeine 30 minutes before checking your BP . Use the restroom before checking your BP (a full bladder can raise your pressure) . Relax in a comfortable upright chair . Feet on the ground . Left arm resting comfortably on a flat surface at the level of your heart . Legs uncrossed . Back supported . Sit quietly and don't talk . Place the cuff on your bare arm . Adjust snuggly, so that only two fingertips can fit between your skin and the top of the cuff . Check 2 readings separated by at least one minute . Keep a log of your BP readings . For a visual, please reference this diagram: http://ccnc.care/bpdiagram  Provider Name: [facility]     Phone: [phone]  Zone 1: ALL CLEAR  Continue to monitor your symptoms:  . BP reading is less than 140 (top number) or less than 90 (bottom number)  . No right upper stomach pain . No headaches or  seeing spots . No feeling nauseated or throwing up . No swelling in face and hands  Zone 2: CAUTION Call your doctor's office for any of the following:  . BP reading is greater than 140 (top number) or greater than 90 (bottom number)  . Stomach pain under your ribs in the middle or right side . Headaches or seeing spots . Feeling nauseated or throwing up . Swelling in face and hands  Zone 3: EMERGENCY  Seek immediate medical care if you have any of the following:  . BP reading is greater than160 (top number) or greater than 110 (bottom number) . Severe headaches not improving with Tylenol . Serious difficulty catching your breath . Any worsening symptoms from Zone 2    First Trimester of  Pregnancy The first trimester of pregnancy is from week 1 until the end of week 12 (months 1 through 3). A week after a sperm fertilizes an egg, the egg will implant on the wall of the uterus. This embryo will begin to develop into a baby. Genes from you and your partner are forming the baby. The female genes determine whether the baby is a boy or a girl. At 6-8 weeks, the eyes and face are formed, and the heartbeat can be seen on ultrasound. At the end of 12 weeks, all the baby's organs are formed.  Now that you are pregnant, you will want to do everything you can to have a healthy baby. Two of the most important things are to get good prenatal care and to follow your health care provider's instructions. Prenatal care is all the medical care you receive before the baby's birth. This care will help prevent, find, and treat any problems during the pregnancy and childbirth. BODY CHANGES Your body goes through many changes during pregnancy. The changes vary from woman to woman.   You may gain or lose a couple of pounds at first.  You may feel sick to your stomach (nauseous) and throw up (vomit). If the vomiting is uncontrollable, call your health care provider.  You may tire easily.  You may develop headaches that can be relieved by medicines approved by your health care provider.  You may urinate more often. Painful urination may mean you have a bladder infection.  You may develop heartburn as a result of your pregnancy.  You may develop constipation because certain hormones are causing the muscles that push waste through your intestines to slow down.  You may develop hemorrhoids or swollen, bulging veins (varicose veins).  Your breasts may begin to grow larger and become tender. Your nipples may stick out more, and the tissue that surrounds them (areola) may become darker.  Your gums may bleed and may be sensitive to brushing and flossing.  Dark spots or blotches (chloasma, mask of pregnancy)  may develop on your face. This will likely fade after the baby is born.  Your menstrual periods will stop.  You may have a loss of appetite.  You may develop cravings for certain kinds of food.  You may have changes in your emotions from day to day, such as being excited to be pregnant or being concerned that something may go wrong with the pregnancy and baby.  You may have more vivid and strange dreams.  You may have changes in your hair. These can include thickening of your hair, rapid growth, and changes in texture. Some women also have hair loss during or after pregnancy, or hair that feels dry or thin. Your  hair will most likely return to normal after your baby is born. WHAT TO EXPECT AT YOUR PRENATAL VISITS During a routine prenatal visit:  You will be weighed to make sure you and the baby are growing normally.  Your blood pressure will be taken.  Your abdomen will be measured to track your baby's growth.  The fetal heartbeat will be listened to starting around week 10 or 12 of your pregnancy.  Test results from any previous visits will be discussed. Your health care provider may ask you:  How you are feeling.  If you are feeling the baby move.  If you have had any abnormal symptoms, such as leaking fluid, bleeding, severe headaches, or abdominal cramping.  If you have any questions. Other tests that may be performed during your first trimester include:  Blood tests to find your blood type and to check for the presence of any previous infections. They will also be used to check for low iron levels (anemia) and Rh antibodies. Later in the pregnancy, blood tests for diabetes will be done along with other tests if problems develop.  Urine tests to check for infections, diabetes, or protein in the urine.  An ultrasound to confirm the proper growth and development of the baby.  An amniocentesis to check for possible genetic problems.  Fetal screens for spina bifida and  Down syndrome.  You may need other tests to make sure you and the baby are doing well. HOME CARE INSTRUCTIONS  Medicines  Follow your health care provider's instructions regarding medicine use. Specific medicines may be either safe or unsafe to take during pregnancy.  Take your prenatal vitamins as directed.  If you develop constipation, try taking a stool softener if your health care provider approves. Diet  Eat regular, well-balanced meals. Choose a variety of foods, such as meat or vegetable-based protein, fish, milk and low-fat dairy products, vegetables, fruits, and whole grain breads and cereals. Your health care provider will help you determine the amount of weight gain that is right for you.  Avoid raw meat and uncooked cheese. These carry germs that can cause birth defects in the baby.  Eating four or five small meals rather than three large meals a day may help relieve nausea and vomiting. If you start to feel nauseous, eating a few soda crackers can be helpful. Drinking liquids between meals instead of during meals also seems to help nausea and vomiting.  If you develop constipation, eat more high-fiber foods, such as fresh vegetables or fruit and whole grains. Drink enough fluids to keep your urine clear or pale yellow. Activity and Exercise  Exercise only as directed by your health care provider. Exercising will help you:  Control your weight.  Stay in shape.  Be prepared for labor and delivery.  Experiencing pain or cramping in the lower abdomen or low back is a good sign that you should stop exercising. Check with your health care provider before continuing normal exercises.  Try to avoid standing for long periods of time. Move your legs often if you must stand in one place for a long time.  Avoid heavy lifting.  Wear low-heeled shoes, and practice good posture.  You may continue to have sex unless your health care provider directs you otherwise. Relief of Pain  or Discomfort  Wear a good support bra for breast tenderness.    Take warm sitz baths to soothe any pain or discomfort caused by hemorrhoids. Use hemorrhoid cream if your health care provider  approves.    Rest with your legs elevated if you have leg cramps or low back pain.  If you develop varicose veins in your legs, wear support hose. Elevate your feet for 15 minutes, 3-4 times a day. Limit salt in your diet. Prenatal Care  Schedule your prenatal visits by the twelfth week of pregnancy. They are usually scheduled monthly at first, then more often in the last 2 months before delivery.  Write down your questions. Take them to your prenatal visits.  Keep all your prenatal visits as directed by your health care provider. Safety  Wear your seat belt at all times when driving.  Make a list of emergency phone numbers, including numbers for family, friends, the hospital, and police and fire departments. General Tips  Ask your health care provider for a referral to a local prenatal education class. Begin classes no later than at the beginning of month 6 of your pregnancy.  Ask for help if you have counseling or nutritional needs during pregnancy. Your health care provider can offer advice or refer you to specialists for help with various needs.  Do not use hot tubs, steam rooms, or saunas.  Do not douche or use tampons or scented sanitary pads.  Do not cross your legs for long periods of time.  Avoid cat litter boxes and soil used by cats. These carry germs that can cause birth defects in the baby and possibly loss of the fetus by miscarriage or stillbirth.  Avoid all smoking, herbs, alcohol, and medicines not prescribed by your health care provider. Chemicals in these affect the formation and growth of the baby.  Schedule a dentist appointment. At home, brush your teeth with a soft toothbrush and be gentle when you floss. SEEK MEDICAL CARE IF:   You have dizziness.  You have mild  pelvic cramps, pelvic pressure, or nagging pain in the abdominal area.  You have persistent nausea, vomiting, or diarrhea.  You have a bad smelling vaginal discharge.  You have pain with urination.  You notice increased swelling in your face, hands, legs, or ankles. SEEK IMMEDIATE MEDICAL CARE IF:   You have a fever.  You are leaking fluid from your vagina.  You have spotting or bleeding from your vagina.  You have severe abdominal cramping or pain.  You have rapid weight gain or loss.  You vomit blood or material that looks like coffee grounds.  You are exposed to [place] measles and have never had them.  You are exposed to fifth disease or chickenpox.  You develop a severe headache.  You have shortness of breath.  You have any kind of trauma, such as from a fall or a car accident. Document Released: 12/02/2001 Document Revised: 04/24/2014 Document Reviewed: 10/18/2013 [facility] Patient Information 2015 [place], [place]. This information is not intended to replace advice given to you by your health care provider. Make sure you discuss any questions you have with your health care provider.

## 2020-10-30 NOTE — Progress Notes (Signed)
[place] 12+1 wks,measurements c/w dates,fhr 162 bpm,posterior placenta gr 0,normal ovaries,crl 58.12 mm,NB present,NT 1 mm

## 2020-10-30 NOTE — Progress Notes (Signed)
INITIAL OBSTETRICAL VISIT Patient name: Joy Hicks MRN [id number]  Date of birth: 1996/09/12 Chief Complaint:   Initial Prenatal Visit (nt/it)  History of Present Illness:   Joy Hicks is a 24 y.o. G59P1001 Caucasian female at [redacted]w[redacted]d by [place] at 8 weeks with an Estimated Date of Delivery: 05/13/21 being seen today for her initial obstetrical visit.   Her obstetrical history is significant for term PCS d/t previa, had A1DM   Today she reports no complaints.  Depression screen [facility] 2/9 10/30/2020 07/05/2019 06/09/2017  Decreased Interest 0 0 0  Down, Depressed, Hopeless 0 0 0  PHQ - 2 Score 0 0 0  Altered sleeping 0 0 -  Tired, decreased energy 1 0 -  Change in appetite 0 0 -  Feeling bad or failure about yourself  0 0 -  Trouble concentrating 0 0 -  Moving slowly or fidgety/restless 0 0 -  Suicidal thoughts 0 0 -  PHQ-9 Score 1 0 -    Patient's last menstrual period was 07/29/2020 (exact date). Last pap 02/10/19. Results were: normal Review of Systems:   Pertinent items are noted in HPI Denies cramping/contractions, leakage of fluid, vaginal bleeding, abnormal vaginal discharge w/ itching/odor/irritation, headaches, visual changes, shortness of breath, chest pain, abdominal pain, severe nausea/vomiting, or problems with urination or bowel movements unless otherwise stated above.  Pertinent History Reviewed:  Reviewed past medical,surgical, social, obstetrical and family history.  Reviewed problem list, medications and allergies. OB History  Gravida Para Term Preterm AB Living  2 1 1  0 0 1  SAB TAB Ectopic Multiple Live Births  0 0 0 0 1    # Outcome Date GA Lbr Len/2nd Weight Sex Delivery Anes PTL Lv  2 Current           1 Term 11/29/19 [redacted]w[redacted]d  5 lb 12.1 oz (2.61 kg) M CS-LTranv Spinal N LIV     Complications: Placenta previa, Gestational diabetes   Physical Assessment:   Vitals:   10/30/20 0915  BP: 97/87  Pulse: 85  Weight: 134 lb (60.8 kg)  Body mass index is 24.12  kg/m.       Physical Examination:  General appearance - well appearing, and in no distress  Mental status - alert, oriented to person, place, and time  Psych:  She has a normal mood and affect  Skin - warm and dry, normal color, no suspicious lesions noted  Chest - effort normal, all lung fields clear to auscultation bilaterally  Heart - normal rate and regular rhythm  Abdomen - soft, nontender  Extremities:  No swelling or varicosities noted  Thin prep pap is not done   Chaperone: N/A    TODAY'S NT 04-29-1974 12+1 wks,measurements c/w dates,fhr 162 bpm,posterior placenta gr 0,normal ovaries,crl 58.12 mm,NB present,NT 1 mm  Results for orders placed or performed in visit on 10/30/20 (from the past 24 hour(s))  POC Urinalysis Dipstick OB   Collection Time: 10/30/20  9:30 AM  Result Value Ref Range   Color, UA     Clarity, UA     Glucose, UA Negative Negative   Bilirubin, UA     Ketones, UA neg    Spec Grav, UA     Blood, UA neg    pH, UA     POC,PROTEIN,UA Negative Negative, Trace, Small (1+), Moderate (2+), Large (3+), 4+   Urobilinogen, UA     Nitrite, UA neg    Leukocytes, UA Negative Negative   Appearance  Odor      Assessment & Plan:  1) Low-Risk Pregnancy G2P1001 at [redacted]w[redacted]d with an Estimated Date of Delivery: 05/13/21   2) Initial OB visit  3) Prev c/s> d/t previa, reviewed TOLAC, consent given to review  4) H/O A1DM> A1C today  Meds: No orders of the defined types were placed in this encounter.   Initial labs obtained Continue prenatal vitamins Reviewed n/v relief measures and warning s/s to report Reviewed recommended weight gain based on pre-gravid BMI Encouraged well-balanced diet Genetic & carrier screening discussed: declines Panorama and Horizon 14 , requests NT/IT Ultrasound discussed; fetal survey: requested [facility] completed> form faxed if has or is planning to apply for medicaid The nature of  - [facility] [employer] with multiple MDs  and other Advanced Practice Providers was explained to patient; also emphasized that fellows, residents, and students are part of our team. Has home bp cuff. Check bp weekly, let us know if >140/90.   Follow-up: Return in about 3 weeks (around 11/20/2020) for LROB, 2nd IT, in person, CNM.   Orders Placed This Encounter  Procedures  . GC/Chlamydia Probe Amp  . Urine Culture  . Integrated 1  . CBC/D/Plt+RPR+Rh+ABO+Rub Ab...  . Pain Management Screening Profile (10S)  . Hemoglobin A1c  . POC Urinalysis Dipstick OB    Cheral Marker CNM, [facility] 10/30/2020 10:14 AM

## 2020-10-31 LAB — INTEGRATED 1

## 2020-11-01 LAB — CBC/D/PLT+RPR+RH+ABO+RUB AB...
Antibody Screen: NEGATIVE
Basophils Absolute: 0.1 10*3/uL (ref 0.0–0.2)
Basos: 1 %
EOS (ABSOLUTE): 0.1 10*3/uL (ref 0.0–0.4)
Eos: 2 %
HCV Ab: <0.1 s/co ratio (ref 0.0–0.9)
HIV Screen 4th Generation wRfx: NONREACTIVE
Hematocrit: 38.3 % (ref 34.0–46.6)
Hemoglobin: 13.1 g/dL (ref 11.1–15.9)
Hepatitis B Surface Ag: NEGATIVE
Immature Grans (Abs): 0 10*3/uL (ref 0.0–0.1)
Immature Granulocytes: 0 %
Lymphocytes Absolute: 1.5 10*3/uL (ref 0.7–3.1)
Lymphs: 23 %
MCH: 29 pg (ref 26.6–33.0)
MCHC: 34.2 g/dL (ref 31.5–35.7)
MCV: 85 fL (ref 79–97)
Monocytes Absolute: 0.4 10*3/uL (ref 0.1–0.9)
Monocytes: 7 %
Neutrophils Absolute: 4.2 10*3/uL (ref 1.4–7.0)
Neutrophils: 67 %
Platelets: 195 10*3/uL (ref 150–450)
RBC: 4.51 x10E6/uL (ref 3.77–5.28)
RDW: 14.4 % (ref 11.7–15.4)
RPR Ser Ql: NONREACTIVE
Rh Factor: POSITIVE
Rubella Antibodies, IGG: 1.41 index (ref >0.99)
WBC: 6.2 10*3/uL (ref 3.4–10.8)

## 2020-11-01 LAB — INTEGRATED 1
Crown Rump Length: 58.1 mm
Gest. Age on Collection Date: 12.1 weeks
Maternal Age at EDD: 24.7 yr
Nuchal Translucency (NT): 1 mm
Number of Fetuses: 1
PAPP-A Value: 2230.4 ng/mL
Weight: 134 [lb_av]

## 2020-11-01 LAB — URINE CULTURE

## 2020-11-01 LAB — HEMOGLOBIN A1C
Est. average glucose Bld gHb Est-mCnc: 103 mg/dL
Hgb A1c MFr Bld: 5.2 % (ref 4.8–5.6)

## 2020-11-01 LAB — HCV INTERPRETATION

## 2020-11-02 LAB — PMP SCREEN PROFILE (10S), URINE
Amphetamine Scrn, Ur: NEGATIVE ng/mL
BARBITURATE SCREEN URINE: NEGATIVE ng/mL
BENZODIAZEPINE SCREEN, URINE: NEGATIVE ng/mL
CANNABINOIDS UR QL SCN: NEGATIVE ng/mL
Cocaine (Metab) Scrn, Ur: NEGATIVE ng/mL
Creatinine(Crt), U: 77.8 mg/dL (ref 20.0–300.0)
Methadone Screen, Urine: NEGATIVE ng/mL
OXYCODONE+OXYMORPHONE UR QL SCN: NEGATIVE ng/mL
Opiate Scrn, Ur: NEGATIVE ng/mL
Ph of Urine: 7.3 (ref 4.5–8.9)
Phencyclidine Qn, Ur: NEGATIVE ng/mL
Propoxyphene Scrn, Ur: NEGATIVE ng/mL

## 2020-11-02 LAB — GC/CHLAMYDIA PROBE AMP
Chlamydia trachomatis, NAA: NEGATIVE
Neisseria Gonorrhoeae by PCR: NEGATIVE

## 2020-11-14 ENCOUNTER — Encounter: Payer: Self-pay | Admitting: *Deleted

## 2020-11-20 ENCOUNTER — Other Ambulatory Visit: Payer: Self-pay

## 2020-11-20 ENCOUNTER — Encounter: Payer: Self-pay | Admitting: Women's Health

## 2020-11-20 ENCOUNTER — Ambulatory Visit (INDEPENDENT_AMBULATORY_CARE_PROVIDER_SITE_OTHER): Payer: 59 | Admitting: Women's Health

## 2020-11-20 VITALS — BP 112/50 | HR 67 | Wt 139.0 lb

## 2020-11-20 DIAGNOSIS — Z363 Encounter for antenatal screening for malformations: Secondary | ICD-10-CM

## 2020-11-20 DIAGNOSIS — Z1389 Encounter for screening for other disorder: Secondary | ICD-10-CM

## 2020-11-20 DIAGNOSIS — Z3480 Encounter for supervision of other normal pregnancy, unspecified trimester: Secondary | ICD-10-CM

## 2020-11-20 DIAGNOSIS — Z1379 Encounter for other screening for genetic and chromosomal anomalies: Secondary | ICD-10-CM

## 2020-11-20 DIAGNOSIS — Z3482 Encounter for supervision of other normal pregnancy, second trimester: Secondary | ICD-10-CM

## 2020-11-20 DIAGNOSIS — Z331 Pregnant state, incidental: Secondary | ICD-10-CM

## 2020-11-20 DIAGNOSIS — Z3A15 15 weeks gestation of pregnancy: Secondary | ICD-10-CM

## 2020-11-20 NOTE — Progress Notes (Signed)
   LOW-RISK PREGNANCY VISIT Patient name: Joy Hicks MRN [id number]  Date of birth: [birth year] Chief Complaint:   Routine Prenatal Visit (2nd IT)  History of Present Illness:   Joy Hicks is a 24 y.o. G70P1001 female at [redacted]w[redacted]d with an Estimated Date of Delivery: 05/13/21 being seen today for ongoing management of a low-risk pregnancy.  Depression screen [facility] 2/9 10/30/2020 07/05/2019 06/09/2017  Decreased Interest 0 0 0  Down, Depressed, Hopeless 0 0 0  PHQ - 2 Score 0 0 0  Altered sleeping 0 0 -  Tired, decreased energy 1 0 -  Change in appetite 0 0 -  Feeling bad or failure about yourself  0 0 -  Trouble concentrating 0 0 -  Moving slowly or fidgety/restless 0 0 -  Suicidal thoughts 0 0 -  PHQ-9 Score 1 0 -    Today she reports discomfort w/ sex, no discharge/itching/odor/irritation. No uti sx. Couldn't void, will try again before she leaves.  Contractions: Not present. Vag. Bleeding: None.  Movement: Absent. denies leaking of fluid. Review of Systems:   Pertinent items are noted in HPI Denies abnormal vaginal discharge w/ itching/odor/irritation, headaches, visual changes, shortness of breath, chest pain, abdominal pain, severe nausea/vomiting, or problems with urination or bowel movements unless otherwise stated above. Pertinent History Reviewed:  Reviewed past medical,surgical, social, obstetrical and family history.  Reviewed problem list, medications and allergies. Physical Assessment:   Vitals:   11/20/20 0914  BP: (!) 112/50  Pulse: 67  Weight: 139 lb (63 kg)  Body mass index is 25.02 kg/m.        Physical Examination:   General appearance: Well appearing, and in no distress  Mental status: Alert, oriented to person, place, and time  Skin: Warm & dry  Cardiovascular: Normal heart rate noted  Respiratory: Normal respiratory effort, no distress  Abdomen: Soft, gravid, nontender  Pelvic: Cervical exam deferred         Extremities: Edema: None  Fetal Status:  Fetal Heart Rate (bpm): 154    Movement: Absent    Chaperone: N/A   No results found for this or any previous visit (from the past 24 hour(s)).  Assessment & Plan:  1) Low-risk pregnancy G2P1001 at [redacted]w[redacted]d with an Estimated Date of Delivery: 05/13/21   2) Prev c/s   Meds: No orders of the defined types were placed in this encounter.  Labs/procedures today: 2nd IT  Plan:  Continue routine obstetrical care  Next visit: prefers will be in person for u/s    Reviewed: Preterm labor symptoms and general obstetric precautions including but not limited to vaginal bleeding, contractions, leaking of fluid and fetal movement were reviewed in detail with the patient.  All questions were answered. Has home bp cuff.  Check bp weekly, let us know if >140/90.   Follow-up: Return in about 3 weeks (around 12/11/2020) for LROB, JO:ACZYSAY, in person, CNM.  No future appointments.  Orders Placed This Encounter  Procedures  . US OB Comp + 14 Wk  . INTEGRATED 2  . POC Urinalysis Dipstick OB   Cheral Marker CNM, [facility] 11/20/2020 9:25 AM

## 2020-11-20 NOTE — Patient Instructions (Signed)
Joy Hicks, I greatly value your feedback.  If you receive a survey following your visit with [place] today, we appreciate you taking the time to fill it out.  Thanks, Joy Hicks, CNM, WHNP-BC  [facility] ([address] [place], [place] [address]) Entrance C, located off of [address] [employer] [address] valet parking  Go to [email or web address] to register for FREE online childbirth classes  [place] Pediatricians/Family Doctors:  [facility] [phone]            [facility] [phone]                 [facility] [phone] (usually not accepting new patients unless you have family there already, you are always welcome to call and ask)       [facility] Department [phone]       [facility] Pediatricians/Family Doctors:   [facility]: [phone]  [facility]: [phone]  [facility]: [phone]  [facility] Doctors:   [facility]: [phone]   [facility]: [phone]  [facility] Doctors:  [facility]: [phone]    Home Blood Pressure Monitoring for Patients   Your provider has recommended that you check your blood pressure (BP) at least once a week at home. If you do not have a blood pressure cuff at home, one will be provided for you. Contact your provider if you have not received your monitor within 1 week.   Helpful Tips for Accurate Home Blood Pressure Checks  . Don't smoke, exercise, or drink caffeine 30 minutes before checking your BP . Use the restroom before checking your BP (a full bladder can raise your pressure) . Relax in a comfortable upright chair . Feet on the ground . Left arm resting comfortably on a flat surface at the level of your heart . Legs uncrossed . Back supported . Sit quietly and don't talk . Place the cuff on your bare arm . Adjust snuggly, so  that only two fingertips can fit between your skin and the top of the cuff . Check 2 readings separated by at least one minute . Keep a log of your BP readings . For a visual, please reference this diagram: http://ccnc.care/bpdiagram  Provider Name: [facility]     Phone: [phone]  Zone 1: ALL CLEAR  Continue to monitor your symptoms:  . BP reading is less than 140 (top number) or less than 90 (bottom number)  . No right upper stomach pain . No headaches or seeing spots . No feeling nauseated or throwing up . No swelling in face and hands  Zone 2: CAUTION Call your doctor's office for any of the following:  . BP reading is greater than 140 (top number) or greater than 90 (bottom number)  . Stomach pain under your ribs in the middle or right side . Headaches or seeing spots . Feeling nauseated or throwing up . Swelling in face and hands  Zone 3: EMERGENCY  Seek immediate medical care if you have any of the following:  . BP reading is greater than160 (top number) or greater than 110 (bottom number) . Severe headaches not improving with Tylenol . Serious difficulty catching your breath . Any worsening symptoms from Zone 2     Second Trimester of Pregnancy The second trimester is from week 14 through week 27 (months 4 through 6). The second trimester is often a time when you feel your  best. Your body has adjusted to being pregnant, and you begin to feel better physically. Usually, morning sickness has lessened or quit completely, you may have more energy, and you may have an increase in appetite. The second trimester is also a time when the fetus is growing rapidly. At the end of the sixth month, the fetus is about 9 inches long and weighs about 1 pounds. You will likely begin to feel the baby move (quickening) between 16 and 20 weeks of pregnancy. Body changes during your second trimester Your body continues to go through many changes during your second trimester. The  changes vary from woman to woman.  Your weight will continue to increase. You will notice your lower abdomen bulging out.  You may begin to get stretch marks on your hips, abdomen, and breasts.  You may develop headaches that can be relieved by medicines. The medicines should be approved by your health care provider.  You may urinate more often because the fetus is pressing on your bladder.  You may develop or continue to have heartburn as a result of your pregnancy.  You may develop constipation because certain hormones are causing the muscles that push waste through your intestines to slow down.  You may develop hemorrhoids or swollen, bulging veins (varicose veins).  You may have back pain. This is caused by: ? Weight gain. ? Pregnancy hormones that are relaxing the joints in your pelvis. ? A shift in weight and the muscles that support your balance.  Your breasts will continue to grow and they will continue to become tender.  Your gums may bleed and may be sensitive to brushing and flossing.  Dark spots or blotches (chloasma, mask of pregnancy) may develop on your face. This will likely fade after the baby is born.  A dark line from your belly button to the pubic area (linea nigra) may appear. This will likely fade after the baby is born.  You may have changes in your hair. These can include thickening of your hair, rapid growth, and changes in texture. Some women also have hair loss during or after pregnancy, or hair that feels dry or thin. Your hair will most likely return to normal after your baby is born.  What to expect at prenatal visits During a routine prenatal visit:  You will be weighed to make sure you and the fetus are growing normally.  Your blood pressure will be taken.  Your abdomen will be measured to track your baby's growth.  The fetal heartbeat will be listened to.  Any test results from the previous visit will be discussed.  Your health care  provider may ask you:  How you are feeling.  If you are feeling the baby move.  If you have had any abnormal symptoms, such as leaking fluid, bleeding, severe headaches, or abdominal cramping.  If you are using any tobacco products, including cigarettes, chewing tobacco, and electronic cigarettes.  If you have any questions.  Other tests that may be performed during your second trimester include:  Blood tests that check for: ? Low iron levels (anemia). ? High blood sugar that affects pregnant women (gestational diabetes) between 71 and 28 weeks. ? Rh antibodies. This is to check for a protein on red blood cells (Rh factor).  Urine tests to check for infections, diabetes, or protein in the urine.  An ultrasound to confirm the proper growth and development of the baby.  An amniocentesis to check for possible genetic problems.  Fetal  screens for spina bifida and Down syndrome.  HIV (human immunodeficiency virus) testing. Routine prenatal testing includes screening for HIV, unless you choose not to have this test.  Follow these instructions at home: Medicines  Follow your health care provider's instructions regarding medicine use. Specific medicines may be either safe or unsafe to take during pregnancy.  Take a prenatal vitamin that contains at least 600 micrograms (mcg) of folic acid.  If you develop constipation, try taking a stool softener if your health care provider approves. Eating and drinking  Eat a balanced diet that includes fresh fruits and vegetables, whole grains, good sources of protein such as meat, eggs, or tofu, and low-fat dairy. Your health care provider will help you determine the amount of weight gain that is right for you.  Avoid raw meat and uncooked cheese. These carry germs that can cause birth defects in the baby.  If you have low calcium intake from food, talk to your health care provider about whether you should take a daily calcium  supplement.  Limit foods that are high in fat and processed sugars, such as fried and sweet foods.  To prevent constipation: ? Drink enough fluid to keep your urine clear or pale yellow. ? Eat foods that are high in fiber, such as fresh fruits and vegetables, whole grains, and beans. Activity  Exercise only as directed by your health care provider. Most women can continue their usual exercise routine during pregnancy. Try to exercise for 30 minutes at least 5 days a week. Stop exercising if you experience uterine contractions.  Avoid heavy lifting, wear low heel shoes, and practice good posture.  A sexual relationship may be continued unless your health care provider directs you otherwise. Relieving pain and discomfort  Wear a good support bra to prevent discomfort from breast tenderness.  Take warm sitz baths to soothe any pain or discomfort caused by hemorrhoids. Use hemorrhoid cream if your health care provider approves.  Rest with your legs elevated if you have leg cramps or low back pain.  If you develop varicose veins, wear support hose. Elevate your feet for 15 minutes, 3-4 times a day. Limit salt in your diet. Prenatal Care  Write down your questions. Take them to your prenatal visits.  Keep all your prenatal visits as told by your health care provider. This is important. Safety  Wear your seat belt at all times when driving.  Make a list of emergency phone numbers, including numbers for family, friends, the hospital, and police and fire departments. General instructions  Ask your health care provider for a referral to a local prenatal education class. Begin classes no later than the beginning of month 6 of your pregnancy.  Ask for help if you have counseling or nutritional needs during pregnancy. Your health care provider can offer advice or refer you to specialists for help with various needs.  Do not use hot tubs, steam rooms, or saunas.  Do not douche or use  tampons or scented sanitary pads.  Do not cross your legs for long periods of time.  Avoid cat litter boxes and soil used by cats. These carry germs that can cause birth defects in the baby and possibly loss of the fetus by miscarriage or stillbirth.  Avoid all smoking, herbs, alcohol, and unprescribed drugs. Chemicals in these products can affect the formation and growth of the baby.  Do not use any products that contain nicotine or tobacco, such as cigarettes and e-cigarettes. If you need help  quitting, ask your health care provider.  Visit your dentist if you have not gone yet during your pregnancy. Use a soft toothbrush to brush your teeth and be gentle when you floss. Contact a health care provider if:  You have dizziness.  You have mild pelvic cramps, pelvic pressure, or nagging pain in the abdominal area.  You have persistent nausea, vomiting, or diarrhea.  You have a bad smelling vaginal discharge.  You have pain when you urinate. Get help right away if:  You have a fever.  You are leaking fluid from your vagina.  You have spotting or bleeding from your vagina.  You have severe abdominal cramping or pain.  You have rapid weight gain or weight loss.  You have shortness of breath with chest pain.  You notice sudden or extreme swelling of your face, hands, ankles, feet, or legs.  You have not felt your baby move in over an hour.  You have severe headaches that do not go away when you take medicine.  You have vision changes. Summary  The second trimester is from week 14 through week 27 (months 4 through 6). It is also a time when the fetus is growing rapidly.  Your body goes through many changes during pregnancy. The changes vary from woman to woman.  Avoid all smoking, herbs, alcohol, and unprescribed drugs. These chemicals affect the formation and growth your baby.  Do not use any tobacco products, such as cigarettes, chewing tobacco, and e-cigarettes. If you  need help quitting, ask your health care provider.  Contact your health care provider if you have any questions. Keep all prenatal visits as told by your health care provider. This is important. This information is not intended to replace advice given to you by your health care provider. Make sure you discuss any questions you have with your health care provider. Document Released: 12/02/2001 Document Revised: 05/15/2016 Document Reviewed: 02/08/2013 [employer] Interactive Patient Education  2017 [employer]   

## 2020-11-22 LAB — INTEGRATED 2
AFP MoM: 0.83
Alpha-Fetoprotein: 25.2 ng/mL
Crown Rump Length: 58.1 mm
DIA MoM: 0.43
DIA Value: 77.9 pg/mL
Estriol, Unconjugated: 1.06 ng/mL
Gest. Age on Collection Date: 12.1 weeks
Gestational Age: 15.1 weeks
Maternal Age at EDD: 24.7 yr
Nuchal Translucency (NT): 1 mm
Nuchal Translucency MoM: 0.77
Number of Fetuses: 1
PAPP-A MoM: 2.24
PAPP-A Value: 2230.4 ng/mL
Test Results:: NEGATIVE
Weight: 134 [lb_av]
Weight: 134 [lb_av]
hCG MoM: 0.54
hCG Value: 27.3 IU/mL
uE3 MoM: 1.33

## 2020-12-19 ENCOUNTER — Other Ambulatory Visit: Payer: Self-pay

## 2020-12-19 ENCOUNTER — Ambulatory Visit (INDEPENDENT_AMBULATORY_CARE_PROVIDER_SITE_OTHER): Payer: 59 | Admitting: Advanced Practice Midwife

## 2020-12-19 ENCOUNTER — Ambulatory Visit (INDEPENDENT_AMBULATORY_CARE_PROVIDER_SITE_OTHER): Payer: 59

## 2020-12-19 VITALS — BP 99/56 | HR 62 | Wt 137.4 lb

## 2020-12-19 DIAGNOSIS — Z363 Encounter for antenatal screening for malformations: Secondary | ICD-10-CM | POA: Diagnosis not present

## 2020-12-19 DIAGNOSIS — Z3480 Encounter for supervision of other normal pregnancy, unspecified trimester: Secondary | ICD-10-CM

## 2020-12-19 DIAGNOSIS — Z331 Pregnant state, incidental: Secondary | ICD-10-CM

## 2020-12-19 DIAGNOSIS — Z3482 Encounter for supervision of other normal pregnancy, second trimester: Secondary | ICD-10-CM | POA: Diagnosis not present

## 2020-12-19 DIAGNOSIS — Z3A19 19 weeks gestation of pregnancy: Secondary | ICD-10-CM

## 2020-12-19 DIAGNOSIS — Z1389 Encounter for screening for other disorder: Secondary | ICD-10-CM

## 2020-12-19 DIAGNOSIS — Z7189 Other specified counseling: Secondary | ICD-10-CM | POA: Diagnosis not present

## 2020-12-19 LAB — POCT URINALYSIS DIPSTICK OB
Blood, UA: NEGATIVE
Glucose, UA: NEGATIVE
Ketones, UA: NEGATIVE
Leukocytes, UA: NEGATIVE
Nitrite, UA: NEGATIVE
POC,PROTEIN,UA: NEGATIVE

## 2020-12-19 NOTE — Progress Notes (Signed)
[place] 19+2 wks,breech,fhr 140 bpm,cx length 3.3 cm,svp of fluid 4.9 cm,posterior fundal placenta gr 0,normal ovaries,EFW 311 g 71%,anatomy complete,no obvious abnormalities

## 2020-12-19 NOTE — Patient Instructions (Signed)
Joy Hicks, I greatly value your feedback.  If you receive a survey following your visit with [place] today, we appreciate you taking the time to fill it out.  Thanks, Philipp Deputy, CNM  [facility] ([address] [place], [place] [address]) Entrance C, located off of [address] valet parking  Go to [email or web address] to register for FREE online childbirth classes  [place] Pediatricians/Family Doctors:  [facility] [phone]            [facility] [phone]                 [facility] [phone] (usually not accepting new patients unless you have family there already, you are always welcome to call and ask)       [facility] [phone]       [facility] Pediatricians/Family Doctors:   [facility]: [phone]  [facility]: [phone]  [facility]: [phone]  [facility] Doctors:   [facility]: [phone]   [facility]: [phone]  [facility] Doctors:  [facility]: [phone]    Home Blood Pressure Monitoring for Patients   Your provider has recommended that you check your blood pressure (BP) at least once a week at home. If you do not have a blood pressure cuff at home, one will be provided for you. Contact your provider if you have not received your monitor within 1 week.   Helpful Tips for Accurate Home Blood Pressure Checks  . Don't smoke, exercise, or drink caffeine 30 minutes before checking your BP . Use the restroom before checking your BP (a full bladder can raise your pressure) . Relax in a comfortable upright chair . Feet on the ground . Left arm resting comfortably on a flat surface at the level of your heart . Legs uncrossed . Back supported . Sit quietly and don't talk . Place the cuff on your bare arm . Adjust snuggly, so that only  two fingertips can fit between your skin and the top of the cuff . Check 2 readings separated by at least one minute . Keep a log of your BP readings . For a visual, please reference this diagram: http://ccnc.care/bpdiagram  Provider Name: [facility]     Phone: [phone]  Zone 1: ALL CLEAR  Continue to monitor your symptoms:  . BP reading is less than 140 (top number) or less than 90 (bottom number)  . No right upper stomach pain . No headaches or seeing spots . No feeling nauseated or throwing up . No swelling in face and hands  Zone 2: CAUTION Call your doctor's office for any of the following:  . BP reading is greater than 140 (top number) or greater than 90 (bottom number)  . Stomach pain under your ribs in the middle or right side . Headaches or seeing spots . Feeling nauseated or throwing up . Swelling in face and hands  Zone 3: EMERGENCY  Seek immediate medical care if you have any of the following:  . BP reading is greater than160 (top number) or greater than 110 (bottom number) . Severe headaches not improving with Tylenol . Serious difficulty catching your breath . Any worsening symptoms from Zone 2     Second Trimester of Pregnancy The second trimester is from week 14 through week 27 (months 4 through 6). The second trimester is often a time when you feel your best.  Your body has adjusted to being pregnant, and you begin to feel better physically. Usually, morning sickness has lessened or quit completely, you may have more energy, and you may have an increase in appetite. The second trimester is also a time when the fetus is growing rapidly. At the end of the sixth month, the fetus is about 9 inches long and weighs about 1 pounds. You will likely begin to feel the baby move (quickening) between 16 and 20 weeks of pregnancy. Body changes during your second trimester Your body continues to go through many changes during your second trimester. The changes vary  from woman to woman.  Your weight will continue to increase. You will notice your lower abdomen bulging out.  You may begin to get stretch marks on your hips, abdomen, and breasts.  You may develop headaches that can be relieved by medicines. The medicines should be approved by your health care provider.  You may urinate more often because the fetus is pressing on your bladder.  You may develop or continue to have heartburn as a result of your pregnancy.  You may develop constipation because certain hormones are causing the muscles that push waste through your intestines to slow down.  You may develop hemorrhoids or swollen, bulging veins (varicose veins).  You may have back pain. This is caused by: ? Weight gain. ? Pregnancy hormones that are relaxing the joints in your pelvis. ? A shift in weight and the muscles that support your balance.  Your breasts will continue to grow and they will continue to become tender.  Your gums may bleed and may be sensitive to brushing and flossing.  Dark spots or blotches (chloasma, mask of pregnancy) may develop on your face. This will likely fade after the baby is born.  A dark line from your belly button to the pubic area (linea nigra) may appear. This will likely fade after the baby is born.  You may have changes in your hair. These can include thickening of your hair, rapid growth, and changes in texture. Some women also have hair loss during or after pregnancy, or hair that feels dry or thin. Your hair will most likely return to normal after your baby is born.  What to expect at prenatal visits During a routine prenatal visit:  You will be weighed to make sure you and the fetus are growing normally.  Your blood pressure will be taken.  Your abdomen will be measured to track your baby's growth.  The fetal heartbeat will be listened to.  Any test results from the previous visit will be discussed.  Your health care provider may ask  you:  How you are feeling.  If you are feeling the baby move.  If you have had any abnormal symptoms, such as leaking fluid, bleeding, severe headaches, or abdominal cramping.  If you are using any tobacco products, including cigarettes, chewing tobacco, and electronic cigarettes.  If you have any questions.  Other tests that may be performed during your second trimester include:  Blood tests that check for: ? Low iron levels (anemia). ? High blood sugar that affects pregnant women (gestational diabetes) between 14 and 28 weeks. ? Rh antibodies. This is to check for a protein on red blood cells (Rh factor).  Urine tests to check for infections, diabetes, or protein in the urine.  An ultrasound to confirm the proper growth and development of the baby.  An amniocentesis to check for possible genetic problems.  Fetal screens  for spina bifida and Down syndrome.  HIV (human immunodeficiency virus) testing. Routine prenatal testing includes screening for HIV, unless you choose not to have this test.  Follow these instructions at home: Medicines  Follow your health care provider's instructions regarding medicine use. Specific medicines may be either safe or unsafe to take during pregnancy.  Take a prenatal vitamin that contains at least 600 micrograms (mcg) of folic acid.  If you develop constipation, try taking a stool softener if your health care provider approves. Eating and drinking  Eat a balanced diet that includes fresh fruits and vegetables, whole grains, good sources of protein such as meat, eggs, or tofu, and low-fat dairy. Your health care provider will help you determine the amount of weight gain that is right for you.  Avoid raw meat and uncooked cheese. These carry germs that can cause birth defects in the baby.  If you have low calcium intake from food, talk to your health care provider about whether you should take a daily calcium supplement.  Limit foods that  are high in fat and processed sugars, such as fried and sweet foods.  To prevent constipation: ? Drink enough fluid to keep your urine clear or pale yellow. ? Eat foods that are high in fiber, such as fresh fruits and vegetables, whole grains, and beans. Activity  Exercise only as directed by your health care provider. Most women can continue their usual exercise routine during pregnancy. Try to exercise for 30 minutes at least 5 days a week. Stop exercising if you experience uterine contractions.  Avoid heavy lifting, wear low heel shoes, and practice good posture.  A sexual relationship may be continued unless your health care provider directs you otherwise. Relieving pain and discomfort  Wear a good support bra to prevent discomfort from breast tenderness.  Take warm sitz baths to soothe any pain or discomfort caused by hemorrhoids. Use hemorrhoid cream if your health care provider approves.  Rest with your legs elevated if you have leg cramps or low back pain.  If you develop varicose veins, wear support hose. Elevate your feet for 15 minutes, 3-4 times a day. Limit salt in your diet. Prenatal Care  Write down your questions. Take them to your prenatal visits.  Keep all your prenatal visits as told by your health care provider. This is important. Safety  Wear your seat belt at all times when driving.  Make a list of emergency phone numbers, including numbers for family, friends, the hospital, and police and fire departments. General instructions  Ask your health care provider for a referral to a local prenatal education class. Begin classes no later than the beginning of month 6 of your pregnancy.  Ask for help if you have counseling or nutritional needs during pregnancy. Your health care provider can offer advice or refer you to specialists for help with various needs.  Do not use hot tubs, steam rooms, or saunas.  Do not douche or use tampons or scented sanitary  pads.  Do not cross your legs for long periods of time.  Avoid cat litter boxes and soil used by cats. These carry germs that can cause birth defects in the baby and possibly loss of the fetus by miscarriage or stillbirth.  Avoid all smoking, herbs, alcohol, and unprescribed drugs. Chemicals in these products can affect the formation and growth of the baby.  Do not use any products that contain nicotine or tobacco, such as cigarettes and e-cigarettes. If you need help quitting,  ask your health care provider.  Visit your dentist if you have not gone yet during your pregnancy. Use a soft toothbrush to brush your teeth and be gentle when you floss. Contact a health care provider if:  You have dizziness.  You have mild pelvic cramps, pelvic pressure, or nagging pain in the abdominal area.  You have persistent nausea, vomiting, or diarrhea.  You have a bad smelling vaginal discharge.  You have pain when you urinate. Get help right away if:  You have a fever.  You are leaking fluid from your vagina.  You have spotting or bleeding from your vagina.  You have severe abdominal cramping or pain.  You have rapid weight gain or weight loss.  You have shortness of breath with chest pain.  You notice sudden or extreme swelling of your face, hands, ankles, feet, or legs.  You have not felt your baby move in over an hour.  You have severe headaches that do not go away when you take medicine.  You have vision changes. Summary  The second trimester is from week 14 through week 27 (months 4 through 6). It is also a time when the fetus is growing rapidly.  Your body goes through many changes during pregnancy. The changes vary from woman to woman.  Avoid all smoking, herbs, alcohol, and unprescribed drugs. These chemicals affect the formation and growth your baby.  Do not use any tobacco products, such as cigarettes, chewing tobacco, and e-cigarettes. If you need help quitting, ask your  health care provider.  Contact your health care provider if you have any questions. Keep all prenatal visits as told by your health care provider. This is important. This information is not intended to replace advice given to you by your health care provider. Make sure you discuss any questions you have with your health care provider. Document Released: 12/02/2001 Document Revised: 05/15/2016 Document Reviewed: 02/08/2013 Elsevier Interactive Patient Education  2017 [employer].

## 2020-12-19 NOTE — Progress Notes (Signed)
LOW-RISK PREGNANCY VISIT Patient name: Joy Hicks MRN [id number]  Date of birth: 1996/05/30 Chief Complaint:   Routine Prenatal Visit  History of Present Illness:   Joy Hicks is a 24 y.o. G55P1001 female at [redacted]w[redacted]d with an Estimated Date of Delivery: 05/13/21 being seen today for ongoing management of a low-risk pregnancy.  Today she reports doing well; got her 2nd Covid shot in June 2021 and is asking about a booster. Contractions: Not present. Vag. Bleeding: None.  Movement: Present. denies leaking of fluid. Review of Systems:   Pertinent items are noted in HPI Denies abnormal vaginal discharge w/ itching/odor/irritation, headaches, visual changes, shortness of breath, chest pain, abdominal pain, severe nausea/vomiting, or problems with urination or bowel movements unless otherwise stated above. Pertinent History Reviewed:  Reviewed past medical,surgical, social, obstetrical and family history.  Reviewed problem list, medications and allergies. Physical Assessment:   Vitals:   12/19/20 1411  BP: (!) 99/56  Pulse: 62  Weight: 137 lb 6.4 oz (62.3 kg)  Body mass index is 24.73 kg/m.        Physical Examination:   General appearance: Well appearing, and in no distress  Mental status: Alert, oriented to person, place, and time  Skin: Warm & dry  Cardiovascular: Normal heart rate noted  Respiratory: Normal respiratory effort, no distress  Abdomen: Soft, gravid, nontender  Pelvic: Cervical exam deferred         Extremities: Edema: None  Fetal Status: Fetal Heart Rate (bpm): 140 u/s   Movement: Present     Anatomy u/s: Joy Hicks 19+2 wks,breech,fhr 140 bpm,cx length 3.3 cm,svp of fluid 4.9 cm,posterior fundal placenta gr 0,normal ovaries,EFW 311 g 71%,anatomy complete,no obvious abnormalities   Results for orders placed or performed in visit on 12/19/20 (from the past 24 hour(s))  POC Urinalysis Dipstick OB   Collection Time: 12/19/20  2:11 PM  Result Value Ref Range   Color, UA      Clarity, UA     Glucose, UA Negative Negative   Bilirubin, UA     Ketones, UA neg    Spec Grav, UA     Blood, UA neg    pH, UA     POC,PROTEIN,UA Negative Negative, Trace, Small (1+), Moderate (2+), Large (3+), 4+   Urobilinogen, UA     Nitrite, UA neg    Leukocytes, UA Negative Negative   Appearance     Odor      Assessment & Plan:  1) Low-risk pregnancy G2P1001 at [redacted]w[redacted]d with an Estimated Date of Delivery: 05/13/21   2) Hx GDM, plan for GTT at 26wks  3) Hx C/S for previa, still decided on rLTCS vs TOLAC; has copy of consent form  COVID-19 Vaccine Counseling: The patient was counseled on the potential benefits and lack of known risks of COVID vaccination, during pregnancy and breastfeeding, during today's visit. The patient's questions and concerns were addressed today, including safety of the vaccination and potential side effects as they have been published by ACOG and [facility]. The patient has been informed that there have not been any documented vaccine related injuries, deaths or birth defects to infant or mom after receiving the COVID-19 vaccine to date. The patient has been made aware that although she is not at increased risk of contracting COVID-19 during pregnancy, she is at increased risk of developing severe disease and complications if she contracts COVID-19 while pregnant. All patient questions were addressed during our visit today. The patient is planning to get a Covid  booster since it has been >66mos since her 2nd shot.     Meds: No orders of the defined types were placed in this encounter.  Labs/procedures today: anatomy u/s  Plan:  Continue routine obstetrical care   Reviewed: Preterm labor symptoms and general obstetric precautions including but not limited to vaginal bleeding, contractions, leaking of fluid and fetal movement were reviewed in detail with the patient.  All questions were answered. Has home bp cuff. Check bp weekly, let Joy Hicks know if >140/90.    Follow-up: Return in about 4 weeks (around 01/16/2021) for LROB, in person.  Orders Placed This Encounter  Procedures  . POC Urinalysis Dipstick OB   [facility] 12/19/2020 2:16 PM

## 2021-01-15 ENCOUNTER — Encounter: Payer: Self-pay | Admitting: *Deleted

## 2021-01-16 ENCOUNTER — Other Ambulatory Visit: Payer: Self-pay

## 2021-01-16 ENCOUNTER — Ambulatory Visit (INDEPENDENT_AMBULATORY_CARE_PROVIDER_SITE_OTHER): Payer: 59 | Admitting: Obstetrics and Gynecology

## 2021-01-16 ENCOUNTER — Encounter: Payer: Self-pay | Admitting: Obstetrics and Gynecology

## 2021-01-16 VITALS — BP 109/56 | HR 73 | Wt 144.0 lb

## 2021-01-16 DIAGNOSIS — Z3A23 23 weeks gestation of pregnancy: Secondary | ICD-10-CM

## 2021-01-16 DIAGNOSIS — Z3480 Encounter for supervision of other normal pregnancy, unspecified trimester: Secondary | ICD-10-CM

## 2021-01-16 NOTE — Patient Instructions (Signed)
Oral Glucose Tolerance Test During Pregnancy Why am I having this test? The oral glucose tolerance test (OGTT) is done to check how your body processes blood sugar (glucose). This is one of several tests used to diagnose diabetes that develops during pregnancy (gestational diabetes mellitus). Gestational diabetes is a short-term form of diabetes that some women develop while they are pregnant. It usually occurs during the second trimester of pregnancy and goes away after delivery. Testing, or screening, for gestational diabetes usually occurs at weeks 24-28 of pregnancy. You may have the OGTT test after having a 1-hour glucose screening test if the results from that test indicate that you may have gestational diabetes. This test may also be needed if:  You have a history of gestational diabetes.  There is a history of giving birth to very large babies or of losing pregnancies (having stillbirths).  You have signs and symptoms of diabetes, such as: ? Changes in your eyesight. ? Tingling or numbness in your hands or feet. ? Changes in hunger, thirst, and urination, and these are not explained by your pregnancy. What is being tested? This test measures the amount of glucose in your blood at different times during a period of 3 hours. This shows how well your body can process glucose. What kind of sample is taken? Blood samples are required for this test. They are usually collected by inserting a needle into a blood vessel.   How do I prepare for this test?  For 3 days before your test, eat normally. Have plenty of carbohydrate-rich foods.  Follow instructions from your health care provider about: ? Eating or drinking restrictions on the day of the test. You may be asked not to eat or drink anything other than water (to fast) starting 8-10 hours before the test. ? Changing or stopping your regular medicines. Some medicines may interfere with this test. Tell a health care provider about:  All  medicines you are taking, including vitamins, herbs, eye drops, creams, and over-the-counter medicines.  Any blood disorders you have.  Any surgeries you have had.  Any medical conditions you have. What happens during the test? First, your blood glucose will be measured. This is referred to as your fasting blood glucose because you fasted before the test. Then, you will drink a glucose solution that contains a certain amount of glucose. Your blood glucose will be measured again 1, 2, and 3 hours after you drink the solution. This test takes about 3 hours to complete. You will need to stay at the testing location during this time. During the testing period:  Do not eat or drink anything other than the glucose solution.  Do not exercise.  Do not use any products that contain nicotine or tobacco, such as cigarettes, e-cigarettes, and chewing tobacco. These can affect your test results. If you need help quitting, ask your health care provider. The testing procedure may vary among health care providers and hospitals. How are the results reported? Your results will be reported as milligrams of glucose per deciliter of blood (mg/dL) or millimoles per liter (mmol/L). There is more than one source for screening and diagnosis reference values used to diagnose gestational diabetes. Your health care provider will compare your results to normal values that were established after testing a large group of people (reference values). Reference values may vary among labs and hospitals. For this test (Carpenter-Coustan), reference values are:  Fasting: 95 mg/dL (5.3 mmol/L).  1 hour: 180 mg/dL (10.0 mmol/L).  2 hour:   155 mg/dL (8.6 mmol/L).  3 hour: 140 mg/dL (7.8 mmol/L). What do the results mean? Results below the reference values are considered normal. If two or more of your blood glucose levels are at or above the reference values, you may be diagnosed with gestational diabetes. If only one level is  high, your health care provider may suggest repeat testing or other tests to confirm a diagnosis. Talk with your health care provider about what your results mean. Questions to ask your health care provider Ask your health care provider, or the department that is doing the test:  When will my results be ready?  How will I get my results?  What are my treatment options?  What other tests do I need?  What are my next steps? Summary  The oral glucose tolerance test (OGTT) is one of several tests used to diagnose diabetes that develops during pregnancy (gestational diabetes mellitus). Gestational diabetes is a short-term form of diabetes that some women develop while they are pregnant.  You may have the OGTT test after having a 1-hour glucose screening test if the results from that test show that you may have gestational diabetes. You may also have this test if you have any symptoms or risk factors for this type of diabetes.  Talk with your health care provider about what your results mean. This information is not intended to replace advice given to you by your health care provider. Make sure you discuss any questions you have with your health care provider. Document Revised: 05/17/2020 Document Reviewed: 05/17/2020 Elsevier Patient Education  2021 [employer]  

## 2021-01-16 NOTE — Progress Notes (Signed)
   LOW-RISK PREGNANCY OFFICE VISIT Patient name: Joy Hicks MRN [id number]  Date of birth: April 07, 1996 Chief Complaint:   Routine Prenatal Visit  History of Present Illness:   Joy Hicks is a 25 y.o. G56P1001 female at [redacted]w[redacted]d with an Estimated Date of Delivery: 05/13/21 being seen today for ongoing management of a low-risk pregnancy.  Today she reports no complaints. She reports noticing her BP was low in My Chart from her last visit. She also reports "feeling light-headed, occasional tunnel vision like she is about to pass out and nauseated from time to time, especially at work (she works at [employer]) where she has to stand a lot. She wants to know if that is cause for concern or normal. She did not have these symptoms when she was pregnant with her first child.  Contractions: Not present. Vag. Bleeding: None.  Movement: Present. denies leaking of fluid. Review of Systems:   Pertinent items are noted in HPI Denies abnormal vaginal discharge w/ itching/odor/irritation, headaches, visual changes, shortness of breath, chest pain, abdominal pain, severe nausea/vomiting, or problems with urination or bowel movements unless otherwise stated above. Pertinent History Reviewed:  Reviewed past medical,surgical, social, obstetrical and family history.  Reviewed problem list, medications and allergies. Physical Assessment:   Vitals:   01/16/21 0852  BP: (!) 109/56  Pulse: 73  Weight: 144 lb (65.3 kg)  Body mass index is 25.92 kg/m.        Physical Examination:   General appearance: Well appearing, and in no distress  Mental status: Alert, oriented to person, place, and time  Skin: Warm & dry  Cardiovascular: Normal heart rate noted  Respiratory: Normal respiratory effort, no distress  Abdomen: Soft, gravid, nontender  Pelvic: Cervical exam deferred         Extremities: Edema: None  Fetal Status: Fetal Heart Rate (bpm): 141 Fundal Height: 24 cm Movement: Present Presentation:  Undeterminable  No results found for this or any previous visit (from the past 24 hour(s)).  Assessment & Plan:  1) Low-risk pregnancy G2P1001 at [redacted]w[redacted]d with an Estimated Date of Delivery: 05/13/21   2) Supervision of other normal pregnancy, antepartum - Discussed increased blood volume and the importance of changing positions slowly, not locking knees while standing, staying well-hydrated, and eating protein with every meal daily - Encouraged to call the office if sx's worsen after trying recommendations - Anticipatory guidance for 2 hr GTT - advised to fast after midnight without anything to eat or drink (except for water), will have fasting blood drawn, drink the glucola drink (flavor choices: orange, fruit punch or lemon-lime), have a visit with a provider during the first hour of testing, wait in the lab waiting room to have blood drawn at 1 hour and then 2 hours after finishing glucola drink.  3) [redacted] weeks gestation of pregnancy  Meds: No orders of the defined types were placed in this encounter.  Labs/procedures today: none  Plan:  Continue routine obstetrical care   Reviewed: Preterm labor symptoms and general obstetric precautions including but not limited to vaginal bleeding, contractions, leaking of fluid and fetal movement were reviewed in detail with the patient.  All questions were answered. Has home bp cuff. Check bp weekly, let us know if >140/90.   Follow-up: Return in about 4 weeks (around 02/13/2021) for Return OB 2hr GTT.  No orders of the defined types were placed in this encounter.  Raelyn Mora MSN, CNM 01/16/2021 9:03 AM

## 2021-02-13 ENCOUNTER — Ambulatory Visit (INDEPENDENT_AMBULATORY_CARE_PROVIDER_SITE_OTHER): Payer: 59 | Admitting: Advanced Practice Midwife

## 2021-02-13 ENCOUNTER — Other Ambulatory Visit: Payer: Self-pay

## 2021-02-13 ENCOUNTER — Other Ambulatory Visit: Payer: 59

## 2021-02-13 VITALS — BP 107/44 | HR 82 | Wt 145.0 lb

## 2021-02-13 DIAGNOSIS — Z3A27 27 weeks gestation of pregnancy: Secondary | ICD-10-CM

## 2021-02-13 DIAGNOSIS — Z3480 Encounter for supervision of other normal pregnancy, unspecified trimester: Secondary | ICD-10-CM

## 2021-02-13 DIAGNOSIS — O09299 Supervision of pregnancy with other poor reproductive or obstetric history, unspecified trimester: Secondary | ICD-10-CM

## 2021-02-13 DIAGNOSIS — Z8632 Personal history of gestational diabetes: Secondary | ICD-10-CM

## 2021-02-13 DIAGNOSIS — Z23 Encounter for immunization: Secondary | ICD-10-CM | POA: Diagnosis not present

## 2021-02-13 DIAGNOSIS — Z131 Encounter for screening for diabetes mellitus: Secondary | ICD-10-CM

## 2021-02-13 DIAGNOSIS — Z98891 History of uterine scar from previous surgery: Secondary | ICD-10-CM

## 2021-02-13 NOTE — Patient Instructions (Signed)
 Glucose Tolerance Test Why am I having this test? The glucose tolerance test (GTT) is done to check how your body processes sugar (glucose). This is one of several tests used to diagnose diabetes (diabetes mellitus). Your health care provider may recommend this test if you:  Have a family history of diabetes.  Are obese.  Have infections that keep coming back.  Have had a lot of wounds that did not heal quickly, especially on your legs and feet.  Are a woman and have a history of giving birth to very large babies or a history of repeated fetal loss (stillbirth).  Have had high glucose levels in your urine or blood: ? During a past pregnancy. ? After a heart attack, surgery, or prolonged periods of high stress. What is being tested? This test measures the amount of glucose in your blood at different times during a period of 2 hours. This indicates how well your body is able to process glucose. What kind of sample is taken? Blood samples are required for this test. They are usually collected by inserting a needle into a blood vessel.   How do I prepare for this test?  For 3 days before your test, eat normally. Have plenty of carbohydrate-rich foods.  Follow instructions from your health care provider about: ? Eating or drinking restrictions on the day of the test. You may be asked to not eat or drink anything other than water (fast) starting 8-12 hours before the test. ? Changing or stopping your regular medicines. Some medicines may interfere with this test. Tell a health care provider about:  All medicines you are taking, including vitamins, herbs, eye drops, creams, and over-the-counter medicines.  Any blood disorders you have.  Any surgeries you have had.  Any medical conditions you have.  Whether you are pregnant or may be pregnant. What happens during the test? First, your blood glucose will be measured. This is referred to as your fasting blood glucose, since you  fasted before the test. Then, you will drink a glucose solution that contains a specific amount of glucose. Your blood glucose will be measured again 1 and 2 hours after drinking the solution. This test takes 2 hours to complete. You will need to stay at the testing location during this time. During the testing period:  Do not eat or drink anything other than the glucose solution. You will be allowed to drink water.  Do not exercise.  Do not use any products that contain nicotine or tobacco. These products include cigarettes, chewing tobacco, and vaping devices, such as e-cigarettes. If you need help quitting, ask your health care provider. The testing procedure may vary among health care providers and hospitals. How are the results reported? Your test results will be reported as values. These will be given as milligrams of glucose per deciliter of blood (mg/dL) or millimoles per liter (mmol/L). Your health care provider will compare your results to normal ranges that were established after testing a large group of people (reference ranges). Reference ranges may vary among labs and hospitals. For this test, common reference ranges are:  Fasting: less than 110 mg/dL (6.1 mmol/L).  1 hour after drinking glucose: less than 180 mg/dL (10.0 mmol/L).  2 hours after drinking glucose: less than 140 mg/dL (7.8 mmol/L). What do the results mean? Results that are within the reference ranges are considered normal, meaning that your glucose levels are well controlled. Results higher than the reference ranges may mean that you recently experienced   stress, such as from an injury or a sudden (acute) condition like a heart attack or stroke, or that you have:  Diabetes.  Cushing syndrome.  Tumors such as pheochromocytoma or glucagonoma.  Kidney failure.  Pancreatitis.  Hyperthyroidism.  An infection. Talk with your health care provider about what your results mean. Questions to ask your health care  provider Ask your health care provider, or the department that is doing the test:  When will my results be ready?  How will I get my results?  What are my treatment options?  What other tests do I need?  What are my next steps? Summary  The GTT is done to check how your body processes glucose. This is one of several tests used to diagnose diabetes.  This test measures the amount of glucose in your blood at different times during a period of 2 hours. This indicates how well your body is able to process glucose.  Talk with your health care provider about what your results mean. This information is not intended to replace advice given to you by your health care provider. Make sure you discuss any questions you have with your health care provider. Document Revised: 09/05/2020 Document Reviewed: 09/05/2020 Elsevier Patient Education  2021 [employer]  

## 2021-02-13 NOTE — Progress Notes (Signed)
   PRENATAL VISIT NOTE  Subjective:  Joy Hicks is a 25 y.o. G2P1001 at [redacted]w[redacted]d being seen today for ongoing prenatal care.  She is currently monitored for the following issues for this low-risk pregnancy and has Allergic reaction; Encounter for supervision of normal pregnancy, antepartum; History of cesarean delivery; and History of gestational diabetes in prior pregnancy, currently pregnant on their problem list.   Patient endorses eating a granola bar about 3 hours ago. States she is unable to fast for 8 hours without experiencing near-syncope.  Patient reports no complaints.  Contractions: Not present. Vag. Bleeding: None.  Movement: Present. Denies leaking of fluid.   The following portions of the patient's history were reviewed and updated as appropriate: allergies, current medications, past family history, past medical history, past social history, past surgical history and problem list. Problem list updated.  Objective:   Vitals:   02/13/21 0853  BP: (!) 107/44  Pulse: 82  Weight: 145 lb (65.8 kg)    Fetal Status: Fetal Heart Rate (bpm): 135 Fundal Height: 26 cm Movement: Present     General:  Alert, oriented and cooperative. Patient is in no acute distress.  Skin: Skin is warm and dry. No rash noted.   Cardiovascular: Normal heart rate noted  Respiratory: Normal respiratory effort, no problems with respiration noted  Abdomen: Soft, gravid, appropriate for gestational age.  Pain/Pressure: Absent     Pelvic: Cervical exam deferred        Extremities: Normal range of motion.  Edema: None  Mental Status: Normal mood and affect. Normal behavior. Normal judgment and thought content.   Assessment and Plan:  Pregnancy: G2P1001 at [redacted]w[redacted]d  1. Supervision of other normal pregnancy, antepartum - No acute concerns today - Kick counts, begin at 28-30 weeks, interventions for low kick number, indications for evaluation in [facility]U   2. [redacted] weeks gestation of pregnancy - Glucose  Tolerance, 1 Hour - TDAP today - Declined Flu  3. History of cesarean delivery - Undecided about delivery plan - If repeat, strongly prefers Dr. Despina Hidden - For MD next visit for additional discussion  4. History of gestational diabetes in prior pregnancy, currently pregnant --Originally scheduled for 2 hour GTT today --Not fasting, had granola bar about 3 hours ago --Patient reports inability to tolerate fasting for 8 hours --Patient has already consumed glucola drink for 2 hour GTT, lab unable to convert to 1 hour today --Will schedule for 1 hour non-fasting ASAP   Preterm labor symptoms and general obstetric precautions including but not limited to vaginal bleeding, contractions, leaking of fluid and fetal movement were reviewed in detail with the patient. Please refer to After Visit Summary for other counseling recommendations.  Return in about 2 weeks (around 02/27/2021).  Future Appointments  Date Time Provider Department Center  02/15/2021  8:50 AM [facility]  02/26/2021  9:10 AM Eure, Amaryllis Dyke, MD [facility]    Calvert Cantor, CNM

## 2021-02-14 LAB — CBC
Hematocrit: 32.7 % — ABNORMAL LOW (ref 34.0–46.6)
Hemoglobin: 10.8 g/dL — ABNORMAL LOW (ref 11.1–15.9)
MCH: 29.3 pg (ref 26.6–33.0)
MCHC: 33 g/dL (ref 31.5–35.7)
MCV: 89 fL (ref 79–97)
Platelets: 189 10*3/uL (ref 150–450)
RBC: 3.69 x10E6/uL — ABNORMAL LOW (ref 3.77–5.28)
RDW: 12 % (ref 11.7–15.4)
WBC: 5.9 10*3/uL (ref 3.4–10.8)

## 2021-02-14 LAB — ANTIBODY SCREEN: Antibody Screen: NEGATIVE

## 2021-02-14 LAB — HIV ANTIBODY (ROUTINE TESTING W REFLEX): HIV Screen 4th Generation wRfx: NONREACTIVE

## 2021-02-14 LAB — RPR: RPR Ser Ql: NONREACTIVE

## 2021-02-15 ENCOUNTER — Other Ambulatory Visit: Payer: 59

## 2021-02-16 LAB — GLUCOSE TOLERANCE, 1 HOUR: Glucose, 1Hr PP: 115 mg/dL (ref 65–199)

## 2021-02-26 ENCOUNTER — Ambulatory Visit (INDEPENDENT_AMBULATORY_CARE_PROVIDER_SITE_OTHER): Payer: 59 | Admitting: Obstetrics & Gynecology

## 2021-02-26 ENCOUNTER — Other Ambulatory Visit: Payer: Self-pay

## 2021-02-26 ENCOUNTER — Encounter: Payer: Self-pay | Admitting: Obstetrics & Gynecology

## 2021-02-26 VITALS — BP 108/65 | HR 86 | Wt 146.0 lb

## 2021-02-26 DIAGNOSIS — Z3480 Encounter for supervision of other normal pregnancy, unspecified trimester: Secondary | ICD-10-CM

## 2021-02-26 DIAGNOSIS — Z98891 History of uterine scar from previous surgery: Secondary | ICD-10-CM

## 2021-02-26 NOTE — Progress Notes (Signed)
   LOW-RISK PREGNANCY VISIT Patient name: Joy Hicks MRN [id number]  Date of birth: 11/13/1996 Chief Complaint:   Routine Prenatal Visit  History of Present Illness:   Joy Hicks is a 25 y.o. G42P1001 female at [redacted]w[redacted]d with an Estimated Date of Delivery: 05/13/21 being seen today for ongoing management of a low-risk pregnancy.  Depression screen [facility] 2/9 02/13/2021 10/30/2020 07/05/2019 06/09/2017  Decreased Interest 0 0 0 0  Down, Depressed, Hopeless 0 0 0 0  PHQ - 2 Score 0 0 0 0  Altered sleeping 1 0 0 -  Tired, decreased energy 0 1 0 -  Change in appetite 0 0 0 -  Feeling bad or failure about yourself  0 0 0 -  Trouble concentrating 0 0 0 -  Moving slowly or fidgety/restless 0 0 0 -  Suicidal thoughts 0 0 0 -  PHQ-9 Score 1 1 0 -    Today she reports no complaints. Contractions: Not present. Vag. Bleeding: None.  Movement: Present. denies leaking of fluid. Review of Systems:   Pertinent items are noted in HPI Denies abnormal vaginal discharge w/ itching/odor/irritation, headaches, visual changes, shortness of breath, chest pain, abdominal pain, severe nausea/vomiting, or problems with urination or bowel movements unless otherwise stated above. Pertinent History Reviewed:  Reviewed past medical,surgical, social, obstetrical and family history.  Reviewed problem list, medications and allergies. Physical Assessment:   Vitals:   02/26/21 0923  BP: 108/65  Pulse: 86  Weight: 146 lb (66.2 kg)  Body mass index is 26.28 kg/m.        Physical Examination:   General appearance: Well appearing, and in no distress  Mental status: Alert, oriented to person, place, and time  Skin: Warm & dry  Cardiovascular: Normal heart rate noted  Respiratory: Normal respiratory effort, no distress  Abdomen: Soft, gravid, nontender  Pelvic: Cervical exam deferred         Extremities: Edema: None  Fetal Status: Fetal Heart Rate (bpm): 142 Fundal Height: 30 cm Movement: Present    Chaperone:  n/a    No results found for this or any previous visit (from the past 24 hour(s)).  Assessment & Plan:  1) Low-risk pregnancy G2P1001 at [redacted]w[redacted]d with an Estimated Date of Delivery: 05/13/21   2) Previous C section, still contemplating , counseling done today Decided to do repeat section 05/06/21 0730 [facility]   Meds: No orders of the defined types were placed in this encounter.  Labs/procedures today:   Plan:  Continue routine obstetrical care  Next visit: prefers in person    Reviewed: Preterm labor symptoms and general obstetric precautions including but not limited to vaginal bleeding, contractions, leaking of fluid and fetal movement were reviewed in detail with the patient.  All questions were answered. Has home bp cuff. Rx faxed to . Check bp weekly, let us know if >140/90.   Follow-up: Return in about 3 weeks (around 03/19/2021).  No orders of the defined types were placed in this encounter.   Lazaro Arms, MD 02/26/2021 9:54 AM

## 2021-03-19 ENCOUNTER — Encounter: Payer: 59 | Admitting: Women's Health

## 2021-03-29 ENCOUNTER — Other Ambulatory Visit: Payer: Self-pay

## 2021-03-29 ENCOUNTER — Encounter: Payer: Self-pay | Admitting: Advanced Practice Midwife

## 2021-03-29 ENCOUNTER — Ambulatory Visit (INDEPENDENT_AMBULATORY_CARE_PROVIDER_SITE_OTHER): Payer: 59 | Admitting: Advanced Practice Midwife

## 2021-03-29 VITALS — BP 100/57 | HR 75 | Wt 148.0 lb

## 2021-03-29 DIAGNOSIS — Z3480 Encounter for supervision of other normal pregnancy, unspecified trimester: Secondary | ICD-10-CM

## 2021-03-29 DIAGNOSIS — O09299 Supervision of pregnancy with other poor reproductive or obstetric history, unspecified trimester: Secondary | ICD-10-CM

## 2021-03-29 DIAGNOSIS — Z3A33 33 weeks gestation of pregnancy: Secondary | ICD-10-CM

## 2021-03-29 DIAGNOSIS — Z8632 Personal history of gestational diabetes: Secondary | ICD-10-CM

## 2021-03-29 DIAGNOSIS — Z98891 History of uterine scar from previous surgery: Secondary | ICD-10-CM

## 2021-03-29 NOTE — Patient Instructions (Signed)
Joy Hicks, I greatly value your feedback.  If you receive a survey following your visit with [place] today, we appreciate you taking the time to fill it out.  Thanks, Philipp Deputy, CNM   [facility] ([address] [place], [place] [address]) Entrance C, located off of [address] valet parking  Go to [email or web address] to register for FREE online childbirth classes   Call the office [phone]) or go to [facility] if:  You begin to have strong, frequent contractions  Your water breaks.  Sometimes it is a big gush of fluid, sometimes it is just a trickle that keeps getting your panties wet or running down your legs  You have vaginal bleeding.  It is normal to have a small amount of spotting if your cervix was checked.   You don't feel your baby moving like normal.  If you don't, get you something to eat and drink and lay down and focus on feeling your baby move.  You should feel at least 10 movements in 2 hours.  If you don't, you should call the office or go to [facility].    Tdap Vaccine  It is recommended that you get the Tdap vaccine during the third trimester of EACH pregnancy to help protect your baby from getting pertussis (whooping cough)  27-36 weeks is the BEST time to do this so that you can pass the protection on to your baby. During pregnancy is better than after pregnancy, but if you are unable to get it during pregnancy it will be offered at the hospital.   You can get this vaccine with [place], at the health department, your family doctor, or some local pharmacies  Everyone who will be around your baby should also be up-to-date on their vaccines before the baby comes. Adults (who are not pregnant) only need 1 dose of Tdap during adulthood.   [place] Pediatricians/Family Doctors:  [facility] [phone]            [facility] [phone]                 [facility] [phone]  (usually not accepting new patients unless you have family there already, you are always welcome to call and ask)       [facility] Department [phone]       [facility] Pediatricians/Family Doctors:   [facility]: [phone]  [facility]: [phone]  [facility]: [phone]  [facility] Doctors:   [facility]: [phone]   [facility]: [phone]  [facility] Doctors:  [facility]: [phone]   Home Blood Pressure Monitoring for Patients   Your provider has recommended that you check your blood pressure (BP) at least once a week at home. If you do not have a blood pressure cuff at home, one will be provided for you. Contact your provider if you have not received your monitor within 1 week.   Helpful Tips for Accurate Home Blood Pressure Checks  . Don't smoke, exercise, or drink caffeine 30 minutes before checking your BP . Use the restroom before checking your BP (a full bladder can raise your pressure) . Relax in a comfortable upright chair . Feet on the ground . Left arm resting comfortably on a flat surface at the level of your heart . Legs uncrossed . Back supported . Sit quietly and don't talk . Place the cuff on your bare  arm . Adjust snuggly, so that only two fingertips can fit between your skin and the top of the cuff . Check 2 readings separated by at least one minute . Keep a log of your BP readings . For a visual, please reference this diagram: http://ccnc.care/bpdiagram  Provider Name: [facility]     Phone: [phone]  Zone 1: ALL CLEAR  Continue to monitor your symptoms:  . BP reading is less than 140 (top number) or less than 90 (bottom number)  . No right upper stomach pain . No headaches or seeing spots . No feeling nauseated or throwing up . No swelling in face and hands  Zone 2: CAUTION Call your doctor's office for  any of the following:  . BP reading is greater than 140 (top number) or greater than 90 (bottom number)  . Stomach pain under your ribs in the middle or right side . Headaches or seeing spots . Feeling nauseated or throwing up . Swelling in face and hands  Zone 3: EMERGENCY  Seek immediate medical care if you have any of the following:  . BP reading is greater than160 (top number) or greater than 110 (bottom number) . Severe headaches not improving with Tylenol . Serious difficulty catching your breath . Any worsening symptoms from Zone 2   Third Trimester of Pregnancy The third trimester is from week 29 through week 42, months 7 through 9. The third trimester is a time when the fetus is growing rapidly. At the end of the ninth month, the fetus is about 20 inches in length and weighs 6-10 pounds.  BODY CHANGES Your body goes through many changes during pregnancy. The changes vary from woman to woman.   Your weight will continue to increase. You can expect to gain 25-35 pounds (11-16 kg) by the end of the pregnancy.  You may begin to get stretch marks on your hips, abdomen, and breasts.  You may urinate more often because the fetus is moving lower into your pelvis and pressing on your bladder.  You may develop or continue to have heartburn as a result of your pregnancy.  You may develop constipation because certain hormones are causing the muscles that push waste through your intestines to slow down.  You may develop hemorrhoids or swollen, bulging veins (varicose veins).  You may have pelvic pain because of the weight gain and pregnancy hormones relaxing your joints between the bones in your pelvis. Backaches may result from overexertion of the muscles supporting your posture.  You may have changes in your hair. These can include thickening of your hair, rapid growth, and changes in texture. Some women also have hair loss during or after pregnancy, or hair that feels dry or thin.  Your hair will most likely return to normal after your baby is born.  Your breasts will continue to grow and be tender. A yellow discharge may leak from your breasts called colostrum.  Your belly button may stick out.  You may feel short of breath because of your expanding uterus.  You may notice the fetus "dropping," or moving lower in your abdomen.  You may have a bloody mucus discharge. This usually occurs a few days to a week before labor begins.  Your cervix becomes thin and soft (effaced) near your due date. WHAT TO EXPECT AT YOUR PRENATAL EXAMS  You will have prenatal exams every 2 weeks until week 36. Then, you will have weekly prenatal exams. During a routine prenatal visit:  You  will be weighed to make sure you and the fetus are growing normally.  Your blood pressure is taken.  Your abdomen will be measured to track your baby's growth.  The fetal heartbeat will be listened to.  Any test results from the previous visit will be discussed.  You may have a cervical check near your due date to see if you have effaced. At around 36 weeks, your caregiver will check your cervix. At the same time, your caregiver will also perform a test on the secretions of the vaginal tissue. This test is to determine if a type of bacteria, Group B streptococcus, is present. Your caregiver will explain this further. Your caregiver may ask you:  What your birth plan is.  How you are feeling.  If you are feeling the baby move.  If you have had any abnormal symptoms, such as leaking fluid, bleeding, severe headaches, or abdominal cramping.  If you have any questions. Other tests or screenings that may be performed during your third trimester include:  Blood tests that check for low iron levels (anemia).  Fetal testing to check the health, activity level, and growth of the fetus. Testing is done if you have certain medical conditions or if there are problems during the pregnancy. FALSE  LABOR You may feel small, irregular contractions that eventually go away. These are called Braxton Hicks contractions, or false labor. Contractions may last for hours, days, or even weeks before true labor sets in. If contractions come at regular intervals, intensify, or become painful, it is best to be seen by your caregiver.  SIGNS OF LABOR   Menstrual-like cramps.  Contractions that are 5 minutes apart or less.  Contractions that start on the top of the uterus and spread down to the lower abdomen and back.  A sense of increased pelvic pressure or back pain.  A watery or bloody mucus discharge that comes from the vagina. If you have any of these signs before the 37th week of pregnancy, call your caregiver right away. You need to go to the hospital to get checked immediately. HOME CARE INSTRUCTIONS   Avoid all smoking, herbs, alcohol, and unprescribed drugs. These chemicals affect the formation and growth of the baby.  Follow your caregiver's instructions regarding medicine use. There are medicines that are either safe or unsafe to take during pregnancy.  Exercise only as directed by your caregiver. Experiencing uterine cramps is a good sign to stop exercising.  Continue to eat regular, healthy meals.  Wear a good support bra for breast tenderness.  Do not use hot tubs, steam rooms, or saunas.  Wear your seat belt at all times when driving.  Avoid raw meat, uncooked cheese, cat litter boxes, and soil used by cats. These carry germs that can cause birth defects in the baby.  Take your prenatal vitamins.  Try taking a stool softener (if your caregiver approves) if you develop constipation. Eat more high-fiber foods, such as fresh vegetables or fruit and whole grains. Drink plenty of fluids to keep your urine clear or pale yellow.  Take warm sitz baths to soothe any pain or discomfort caused by hemorrhoids. Use hemorrhoid cream if your caregiver approves.  If you develop varicose  veins, wear support hose. Elevate your feet for 15 minutes, 3-4 times a day. Limit salt in your diet.  Avoid heavy lifting, wear low heal shoes, and practice good posture.  Rest a lot with your legs elevated if you have leg cramps or low back  pain.  Visit your dentist if you have not gone during your pregnancy. Use a soft toothbrush to brush your teeth and be gentle when you floss.  A sexual relationship may be continued unless your caregiver directs you otherwise.  Do not travel far distances unless it is absolutely necessary and only with the approval of your caregiver.  Take prenatal classes to understand, practice, and ask questions about the labor and delivery.  Make a trial run to the hospital.  Pack your hospital bag.  Prepare the baby's nursery.  Continue to go to all your prenatal visits as directed by your caregiver. SEEK MEDICAL CARE IF:  You are unsure if you are in labor or if your water has broken.  You have dizziness.  You have mild pelvic cramps, pelvic pressure, or nagging pain in your abdominal area.  You have persistent nausea, vomiting, or diarrhea.  You have a bad smelling vaginal discharge.  You have pain with urination. SEEK IMMEDIATE MEDICAL CARE IF:   You have a fever.  You are leaking fluid from your vagina.  You have spotting or bleeding from your vagina.  You have severe abdominal cramping or pain.  You have rapid weight loss or gain.  You have shortness of breath with chest pain.  You notice sudden or extreme swelling of your face, hands, ankles, feet, or legs.  You have not felt your baby move in over an hour.  You have severe headaches that do not go away with medicine.  You have vision changes. Document Released: 12/02/2001 Document Revised: 12/13/2013 Document Reviewed: 02/08/2013 [employer] Patient Information 2015 [place], [place]. This information is not intended to replace advice given to you by your health care provider. Make  sure you discuss any questions you have with your health care provider.

## 2021-03-29 NOTE — Progress Notes (Signed)
   LOW-RISK PREGNANCY VISIT Patient name: Joy Hicks MRN [id number]  Date of birth: 04-Nov-1996 Chief Complaint:   Routine Prenatal Visit  History of Present Illness:   Joy Hicks is a 25 y.o. G36P1001 female at [redacted]w[redacted]d with an Estimated Date of Delivery: 05/13/21 being seen today for ongoing management of a low-risk pregnancy.  Today she reports no complaints. Contractions: Not present. Vag. Bleeding: None.  Movement: Present. denies leaking of fluid. Review of Systems:   Pertinent items are noted in HPI Denies abnormal vaginal discharge w/ itching/odor/irritation, headaches, visual changes, shortness of breath, chest pain, abdominal pain, severe nausea/vomiting, or problems with urination or bowel movements unless otherwise stated above. Pertinent History Reviewed:  Reviewed past medical,surgical, social, obstetrical and family history.  Reviewed problem list, medications and allergies. Physical Assessment:   Vitals:   03/29/21 0905  BP: (!) 100/57  Pulse: 75  Weight: 148 lb (67.1 kg)  Body mass index is 26.64 kg/m.        Physical Examination:   General appearance: Well appearing, and in no distress  Mental status: Alert, oriented to person, place, and time  Skin: Warm & dry  Cardiovascular: Normal heart rate noted  Respiratory: Normal respiratory effort, no distress  Abdomen: Soft, gravid, nontender  Pelvic: Cervical exam deferred         Extremities: Edema: None  Fetal Status: Fetal Heart Rate (bpm): 148 Fundal Height: 33 cm Movement: Present    No results found for this or any previous visit (from the past 24 hour(s)).  Assessment & Plan:  1) Low-risk pregnancy G2P1001 at [redacted]w[redacted]d with an Estimated Date of Delivery: 05/13/21   2) Previous C/S (previa), plans repeat on 05/06/21 w LHE   Meds: No orders of the defined types were placed in this encounter.  Labs/procedures today: none  Plan:  Continue routine obstetrical care   Reviewed: Preterm labor symptoms and  general obstetric precautions including but not limited to vaginal bleeding, contractions, leaking of fluid and fetal movement were reviewed in detail with the patient.  All questions were answered. Has home bp cuff. Check bp weekly, let us know if >140/90.   Follow-up: Return for 2-3wks, on 4/25 or after, LROB, GBS/cultures.  No orders of the defined types were placed in this encounter.  Arabella Merles CNM 03/29/2021 9:26 AM

## 2021-04-15 ENCOUNTER — Other Ambulatory Visit: Payer: Self-pay

## 2021-04-15 ENCOUNTER — Ambulatory Visit (INDEPENDENT_AMBULATORY_CARE_PROVIDER_SITE_OTHER): Payer: 59 | Admitting: Women's Health

## 2021-04-15 ENCOUNTER — Other Ambulatory Visit (HOSPITAL_COMMUNITY)
Admission: RE | Admit: 2021-04-15 | Discharge: 2021-04-15 | Disposition: A | Discharge status: Home / Self Care | Payer: 59 | Source: Ambulatory Visit | Attending: Advanced Practice Midwife | Admitting: Advanced Practice Midwife

## 2021-04-15 ENCOUNTER — Encounter: Payer: Self-pay | Admitting: Women's Health

## 2021-04-15 VITALS — BP 107/63 | HR 81 | Wt 148.5 lb

## 2021-04-15 DIAGNOSIS — Z3480 Encounter for supervision of other normal pregnancy, unspecified trimester: Secondary | ICD-10-CM

## 2021-04-15 DIAGNOSIS — Z3A36 36 weeks gestation of pregnancy: Secondary | ICD-10-CM | POA: Insufficient documentation

## 2021-04-15 DIAGNOSIS — Z3483 Encounter for supervision of other normal pregnancy, third trimester: Secondary | ICD-10-CM

## 2021-04-15 NOTE — Patient Instructions (Signed)
Joy Hicks, I greatly value your feedback.  If you receive a survey following your visit with [place] today, we appreciate you taking the time to fill it out.  Thanks, [name], CNM, WHNP-BC  [facility] ([address] [place], [place] [address]) Entrance C, located off of [address] valet parking   Go to [email or web address] to register for FREE online childbirth classes    Call the office [phone]) or go to [facility] if:  You begin to have strong, frequent contractions  Your water breaks.  Sometimes it is a big gush of fluid, sometimes it is just a trickle that keeps getting your panties wet or running down your legs  You have vaginal bleeding.  It is normal to have a small amount of spotting if your cervix was checked.   You don't feel your baby moving like normal.  If you don't, get you something to eat and drink and lay down and focus on feeling your baby move.  You should feel at least 10 movements in 2 hours.  If you don't, you should call the office or go to [facility].   Call the office [phone]) or go to [facility] hospital for these signs of pre-eclampsia:  Severe headache that does not go away with Tylenol  Visual changes- seeing spots, double, blurred vision  Pain under your right breast or upper abdomen that does not go away with Tums or heartburn medicine  Nausea and/or vomiting  Severe swelling in your hands, feet, and face    Home Blood Pressure Monitoring for Patients   Your provider has recommended that you check your blood pressure (BP) at least once a week at home. If you do not have a blood pressure cuff at home, one will be provided for you. Contact your provider if you have not received your monitor within 1 week.   Helpful Tips for Accurate Home Blood Pressure Checks  . Don't smoke, exercise, or drink caffeine 30 minutes before checking your BP . Use the restroom before checking your BP (a full  bladder can raise your pressure) . Relax in a comfortable upright chair . Feet on the ground . Left arm resting comfortably on a flat surface at the level of your heart . Legs uncrossed . Back supported . Sit quietly and don't talk . Place the cuff on your bare arm . Adjust snuggly, so that only two fingertips can fit between your skin and the top of the cuff . Check 2 readings separated by at least one minute . Keep a log of your BP readings . For a visual, please reference this diagram: http://ccnc.care/bpdiagram  Provider Name: [facility]     Phone: [phone]  Zone 1: ALL CLEAR  Continue to monitor your symptoms:  . BP reading is less than 140 (top number) or less than 90 (bottom number)  . No right upper stomach pain . No headaches or seeing spots . No feeling nauseated or throwing up . No swelling in face and hands  Zone 2: CAUTION Call your doctor's office for any of the following:  . BP reading is greater than 140 (top number) or greater than 90 (bottom number)  . Stomach pain under your ribs in the middle or right side . Headaches or seeing spots . Feeling nauseated or throwing up . Swelling in face and hands  Zone 3: EMERGENCY  Seek immediate medical care if you have any of  the following:  . BP reading is greater than160 (top number) or greater than 110 (bottom number) . Severe headaches not improving with Tylenol . Serious difficulty catching your breath . Any worsening symptoms from Zone 2  Preterm Labor and Birth Information  The normal length of a pregnancy is 39-41 weeks. Preterm labor is when labor starts before 37 completed weeks of pregnancy. What are the risk factors for preterm labor? Preterm labor is more likely to occur in women who:  Have certain infections during pregnancy such as a bladder infection, sexually transmitted infection, or infection inside the uterus (chorioamnionitis).  Have a shorter-than-normal cervix.  Have gone into  preterm labor before.  Have had surgery on their cervix.  Are younger than age 54 or older than age 25.  Are African American.  Are pregnant with twins or multiple babies (multiple gestation).  Take street drugs or smoke while pregnant.  Do not gain enough weight while pregnant.  Became pregnant shortly after having been pregnant. What are the symptoms of preterm labor? Symptoms of preterm labor include:  Cramps similar to those that can happen during a menstrual period. The cramps may happen with diarrhea.  Pain in the abdomen or lower back.  Regular uterine contractions that may feel like tightening of the abdomen.  A feeling of increased pressure in the pelvis.  Increased watery or bloody mucus discharge from the vagina.  Water breaking (ruptured amniotic sac). Why is it important to recognize signs of preterm labor? It is important to recognize signs of preterm labor because babies who are born prematurely may not be fully developed. This can put them at an increased risk for:  Long-term (chronic) heart and lung problems.  Difficulty immediately after birth with regulating body systems, including blood sugar, body temperature, heart rate, and breathing rate.  Bleeding in the brain.  Cerebral palsy.  Learning difficulties.  Death. These risks are highest for babies who are born before 48 weeks of pregnancy. How is preterm labor treated? Treatment depends on the length of your pregnancy, your condition, and the health of your baby. It may involve: 1. Having a stitch (suture) placed in your cervix to prevent your cervix from opening too early (cerclage). 2. Taking or being given medicines, such as: ? Hormone medicines. These may be given early in pregnancy to help support the pregnancy. ? Medicine to stop contractions. ? Medicines to help mature the baby's lungs. These may be prescribed if the risk of delivery is high. ? Medicines to prevent your baby from  developing cerebral palsy. If the labor happens before 34 weeks of pregnancy, you may need to stay in the hospital. What should I do if I think I am in preterm labor? If you think that you are going into preterm labor, call your health care provider right away. How can I prevent preterm labor in future pregnancies? To increase your chance of having a full-term pregnancy:  Do not use any tobacco products, such as cigarettes, chewing tobacco, and e-cigarettes. If you need help quitting, ask your health care provider.  Do not use street drugs or medicines that have not been prescribed to you during your pregnancy.  Talk with your health care provider before taking any herbal supplements, even if you have been taking them regularly.  Make sure you gain a healthy amount of weight during your pregnancy.  Watch for infection. If you think that you might have an infection, get it checked right away.  Make sure to  tell your health care provider if you have gone into preterm labor before. This information is not intended to replace advice given to you by your health care provider. Make sure you discuss any questions you have with your health care provider. Document Revised: 04/01/2019 Document Reviewed: 04/30/2016 Else[employer] Patient Education  [place].

## 2021-04-15 NOTE — Progress Notes (Signed)
   LOW-RISK PREGNANCY VISIT Patient name: Joy Hicks MRN [id number]  Date of birth: 10/09/1996 Chief Complaint:   Routine Prenatal Visit (GC/CHL, GBS today; + pressure)  History of Present Illness:   Joy Hicks is a 25 y.o. G6P1001 female at [redacted]w[redacted]d with an Estimated Date of Delivery: 05/13/21 being seen today for ongoing management of a low-risk pregnancy.  Depression screen [facility] 2/9 02/13/2021 10/30/2020 07/05/2019 06/09/2017  Decreased Interest 0 0 0 0  Down, Depressed, Hopeless 0 0 0 0  PHQ - 2 Score 0 0 0 0  Altered sleeping 1 0 0 -  Tired, decreased energy 0 1 0 -  Change in appetite 0 0 0 -  Feeling bad or failure about yourself  0 0 0 -  Trouble concentrating 0 0 0 -  Moving slowly or fidgety/restless 0 0 0 -  Suicidal thoughts 0 0 0 -  PHQ-9 Score 1 1 0 -    Today she reports lots of pressure. Contractions: Not present. Vag. Bleeding: None.  Movement: Present. denies leaking of fluid. Review of Systems:   Pertinent items are noted in HPI Denies abnormal vaginal discharge w/ itching/odor/irritation, headaches, visual changes, shortness of breath, chest pain, abdominal pain, severe nausea/vomiting, or problems with urination or bowel movements unless otherwise stated above. Pertinent History Reviewed:  Reviewed past medical,surgical, social, obstetrical and family history.  Reviewed problem list, medications and allergies. Physical Assessment:   Vitals:   04/15/21 1158  BP: 107/63  Pulse: 81  Weight: 148 lb 8 oz (67.4 kg)  Body mass index is 26.73 kg/m.        Physical Examination:   General appearance: Well appearing, and in no distress  Mental status: Alert, oriented to person, place, and time  Skin: Warm & dry  Cardiovascular: Normal heart rate noted  Respiratory: Normal respiratory effort, no distress  Abdomen: Soft, gravid, nontender  Pelvic: Cervical exam performed  Dilation: 1 Effacement (%): 50 Station: -2  Extremities: Edema: None  Fetal Status: Fetal  Heart Rate (bpm): 130 Fundal Height: 35 cm Movement: Present Presentation: Vertex  Chaperone: Malachy Mood   No results found for this or any previous visit (from the past 24 hour(s)).  Assessment & Plan:  1) Low-risk pregnancy G2P1001 at [redacted]w[redacted]d with an Estimated Date of Delivery: 05/13/21   2) Prev c/s, for RCS 5/16  3) H/O GDM> normal this pregnancy   Meds: No orders of the defined types were placed in this encounter.  Labs/procedures today: GBS, GC/CT and SVE  Plan:  Continue routine obstetrical care  Next visit: prefers in person    Reviewed: Preterm labor symptoms and general obstetric precautions including but not limited to vaginal bleeding, contractions, leaking of fluid and fetal movement were reviewed in detail with the patient.  All questions were answered. Does have home bp cuff. Office bp cuff given: not applicable. Check bp weekly, let us know if consistently >140 and/or >90.  Follow-up: Return in about 1 week (around 04/22/2021) for LROB, CNM, in person.  No future appointments.  Orders Placed This Encounter  Procedures  . Strep Gp B NAA   Cheral Marker CNM, [facility] 04/15/2021 12:14 PM

## 2021-04-16 LAB — CERVICOVAGINAL ANCILLARY ONLY
Chlamydia: NEGATIVE
Comment: NEGATIVE
Comment: NORMAL
Neisseria Gonorrhea: NEGATIVE

## 2021-04-17 ENCOUNTER — Encounter: Payer: 59 | Admitting: Advanced Practice Midwife

## 2021-04-17 LAB — STREP GP B NAA: Strep Gp B NAA: NEGATIVE

## 2021-04-22 NOTE — Patient Instructions (Signed)
Joy Hicks  04/22/2021   Your procedure is scheduled on:  05/06/2021  Arrive at 0530 at Entrance C on [facility] at [facility]  and [employer]. You are invited to use the FREE valet parking or use the Visitor's parking deck.  Pick up the phone at the desk and dial [phone].  Call this number if you have problems the morning of surgery: [phone]  Remember:   Do not eat food:(After Midnight) Desps de medianoche.  Do not drink clear liquids: (After Midnight) Desps de medianoche.  Take these medicines the morning of surgery with A SIP OF WATER:  none   Do not wear jewelry, make-up or nail polish.  Do not wear lotions, powders, or perfumes. Do not wear deodorant.  Do not shave 48 hours prior to surgery.  Do not bring valuables to the hospital.  [facility] is not   responsible for any belongings or valuables brought to the hospital.  Contacts, dentures or bridgework may not be worn into surgery.  Leave suitcase in the car. After surgery it may be brought to your room.  For patients admitted to the hospital, checkout time is 11:00 AM the day of              discharge.      Please read over the following fact sheets that you were given:     Preparing for Surgery

## 2021-04-23 ENCOUNTER — Telehealth (HOSPITAL_COMMUNITY): Payer: Self-pay | Admitting: *Deleted

## 2021-04-23 NOTE — Telephone Encounter (Signed)
Preadmission screen  

## 2021-04-24 ENCOUNTER — Encounter (HOSPITAL_COMMUNITY): Payer: Self-pay

## 2021-04-24 ENCOUNTER — Other Ambulatory Visit: Payer: Self-pay

## 2021-04-24 ENCOUNTER — Ambulatory Visit (INDEPENDENT_AMBULATORY_CARE_PROVIDER_SITE_OTHER): Payer: 59 | Admitting: Advanced Practice Midwife

## 2021-04-24 VITALS — BP 116/66 | HR 79 | Wt 150.0 lb

## 2021-04-24 DIAGNOSIS — Z3480 Encounter for supervision of other normal pregnancy, unspecified trimester: Secondary | ICD-10-CM

## 2021-04-24 DIAGNOSIS — Z3A37 37 weeks gestation of pregnancy: Secondary | ICD-10-CM

## 2021-04-24 DIAGNOSIS — Z3483 Encounter for supervision of other normal pregnancy, third trimester: Secondary | ICD-10-CM

## 2021-04-24 NOTE — Patient Instructions (Signed)

## 2021-04-24 NOTE — Progress Notes (Signed)
   LOW-RISK PREGNANCY VISIT Patient name: Joy Hicks MRN [id number]  Date of birth: 24-Oct-1996 Chief Complaint:   Routine Prenatal Visit  History of Present Illness:   Joy Hicks is a 25 y.o. G29P1001 female at [redacted]w[redacted]d with an Estimated Date of Delivery: 05/13/21 being seen today for ongoing management of a low-risk pregnancy.  Today she reports having some pelvic discomfort, but otherwise okay. Contractions: Not present. Vag. Bleeding: None.  Movement: Present. denies leaking of fluid. Review of Systems:   Pertinent items are noted in HPI Denies abnormal vaginal discharge w/ itching/odor/irritation, headaches, visual changes, shortness of breath, chest pain, abdominal pain, severe nausea/vomiting, or problems with urination or bowel movements unless otherwise stated above. Pertinent History Reviewed:  Reviewed past medical,surgical, social, obstetrical and family history.  Reviewed problem list, medications and allergies. Physical Assessment:   Vitals:   04/24/21 1156  BP: 116/66  Pulse: 79  Weight: 150 lb (68 kg)  Body mass index is 27 kg/m.        Physical Examination:   General appearance: Well appearing, and in no distress  Mental status: Alert, oriented to person, place, and time  Skin: Warm & dry  Cardiovascular: Normal heart rate noted  Respiratory: Normal respiratory effort, no distress  Abdomen: Soft, gravid, nontender  Pelvic: Cervical exam deferred         Extremities: Edema: None  Fetal Status: Fetal Heart Rate (bpm): 134 Fundal Height: 36 cm Movement: Present Presentation: Vertex  No results found for this or any previous visit (from the past 24 hour(s)).  Assessment & Plan:  1) Low-risk pregnancy G2P1001 at [redacted]w[redacted]d with an Estimated Date of Delivery: 05/13/21   2) Previous C/S, for repeat 05/06/21   Meds: No orders of the defined types were placed in this encounter.  Labs/procedures today: none  Plan:  Continue routine obstetrical care   Reviewed: Term  labor symptoms and general obstetric precautions including but not limited to vaginal bleeding, contractions, leaking of fluid and fetal movement were reviewed in detail with the patient.  All questions were answered. Has home bp cuff.  Check bp weekly, let us know if >140/90.   Follow-up: Return in about 1 week (around 05/01/2021) for LROB, in person.  No orders of the defined types were placed in this encounter.  Arabella Merles CNM 04/24/2021 12:13 PM

## 2021-05-02 ENCOUNTER — Encounter: Payer: 59 | Admitting: Women's Health

## 2021-05-02 ENCOUNTER — Other Ambulatory Visit: Payer: Self-pay | Admitting: Obstetrics & Gynecology

## 2021-05-03 ENCOUNTER — Other Ambulatory Visit (HOSPITAL_COMMUNITY)
Admission: RE | Admit: 2021-05-03 | Discharge: 2021-05-03 | Disposition: A | Discharge status: Home / Self Care | Payer: 59 | Source: Ambulatory Visit | Attending: Obstetrics & Gynecology | Admitting: Obstetrics & Gynecology

## 2021-05-03 ENCOUNTER — Other Ambulatory Visit: Payer: Self-pay

## 2021-05-03 ENCOUNTER — Ambulatory Visit: Payer: 59 | Admitting: Pediatrics

## 2021-05-03 ENCOUNTER — Encounter (HOSPITAL_COMMUNITY)
Admission: RE | Admit: 2021-05-03 | Discharge: 2021-05-03 | Disposition: A | Discharge status: Home / Self Care | Payer: 59 | Source: Ambulatory Visit | Attending: Obstetrics & Gynecology | Admitting: Obstetrics & Gynecology

## 2021-05-03 ENCOUNTER — Other Ambulatory Visit (HOSPITAL_COMMUNITY): Payer: 59

## 2021-05-03 DIAGNOSIS — Z01812 Encounter for preprocedural laboratory examination: Secondary | ICD-10-CM | POA: Insufficient documentation

## 2021-05-03 DIAGNOSIS — Z20822 Contact with and (suspected) exposure to covid-19: Secondary | ICD-10-CM | POA: Insufficient documentation

## 2021-05-03 LAB — TYPE AND SCREEN
ABO/RH(D): O POS
Antibody Screen: NEGATIVE

## 2021-05-03 LAB — CBC
HCT: 30 % — ABNORMAL LOW (ref 36.0–46.0)
Hemoglobin: 9.4 g/dL — ABNORMAL LOW (ref 12.0–15.0)
MCH: 26 pg (ref 26.0–34.0)
MCHC: 31.3 g/dL (ref 30.0–36.0)
MCV: 82.9 fL (ref 80.0–100.0)
Platelets: 163 10*3/uL (ref 150–400)
RBC: 3.62 MIL/uL — ABNORMAL LOW (ref 3.87–5.11)
RDW: 12.9 % (ref 11.5–15.5)
WBC: 5.9 10*3/uL (ref 4.0–10.5)
nRBC: 0 % (ref 0.0–0.2)

## 2021-05-03 LAB — RPR: RPR Ser Ql: NONREACTIVE

## 2021-05-04 LAB — SARS CORONAVIRUS 2 (TAT 6-24 HRS): SARS Coronavirus 2: NEGATIVE

## 2021-05-06 ENCOUNTER — Other Ambulatory Visit: Payer: Self-pay

## 2021-05-06 ENCOUNTER — Encounter (HOSPITAL_COMMUNITY): Payer: Self-pay | Admitting: Obstetrics & Gynecology

## 2021-05-06 ENCOUNTER — Encounter (HOSPITAL_COMMUNITY)
Admission: AD | Disposition: A | Discharge status: Home / Self Care | Payer: Self-pay | Source: Home / Self Care | Attending: Obstetrics & Gynecology

## 2021-05-06 ENCOUNTER — Inpatient Hospital Stay (HOSPITAL_COMMUNITY): Payer: 59 | Admitting: Anesthesiology

## 2021-05-06 ENCOUNTER — Inpatient Hospital Stay (HOSPITAL_COMMUNITY)
Admission: AD | Admit: 2021-05-06 | Discharge: 2021-05-08 | DRG: 788 | Disposition: A | Discharge status: Home / Self Care | Payer: 59 | Attending: Obstetrics & Gynecology | Admitting: Obstetrics & Gynecology

## 2021-05-06 DIAGNOSIS — Z8632 Personal history of gestational diabetes: Secondary | ICD-10-CM | POA: Diagnosis not present

## 2021-05-06 DIAGNOSIS — Z3490 Encounter for supervision of normal pregnancy, unspecified, unspecified trimester: Secondary | ICD-10-CM

## 2021-05-06 DIAGNOSIS — O34211 Maternal care for low transverse scar from previous cesarean delivery: Principal | ICD-10-CM | POA: Diagnosis present

## 2021-05-06 DIAGNOSIS — O9081 Anemia of the puerperium: Secondary | ICD-10-CM | POA: Diagnosis not present

## 2021-05-06 DIAGNOSIS — O09299 Supervision of pregnancy with other poor reproductive or obstetric history, unspecified trimester: Secondary | ICD-10-CM

## 2021-05-06 DIAGNOSIS — Z98891 History of uterine scar from previous surgery: Secondary | ICD-10-CM

## 2021-05-06 DIAGNOSIS — Z20822 Contact with and (suspected) exposure to covid-19: Secondary | ICD-10-CM | POA: Diagnosis present

## 2021-05-06 DIAGNOSIS — Z3A39 39 weeks gestation of pregnancy: Secondary | ICD-10-CM

## 2021-05-06 SURGERY — Surgical Case
Anesthesia: Spinal

## 2021-05-06 MED ORDER — SODIUM CHLORIDE 0.9% FLUSH: 3.0000 mL | INTRAVENOUS | Status: DC | PRN

## 2021-05-06 MED ORDER — ONDANSETRON HCL 4 MG/2ML IJ SOLN
INTRAMUSCULAR | Status: AC
  Filled 2021-05-06: qty 2

## 2021-05-06 MED ORDER — METHYLERGONOVINE MALEATE 0.2 MG PO TABS
0.2000 mg | ORAL_TABLET | ORAL | Status: AC
  Administered 2021-05-06 – 2021-05-07 (×4): 0.2 mg via ORAL
  Filled 2021-05-06 (×4): qty 1

## 2021-05-06 MED ORDER — COCONUT OIL OIL: 1.0000 "application " | TOPICAL_OIL | Status: DC | PRN

## 2021-05-06 MED ORDER — LACTATED RINGERS IV SOLN: INTRAVENOUS | Status: DC

## 2021-05-06 MED ORDER — MENTHOL 3 MG MT LOZG: 1.0000 | LOZENGE | OROMUCOSAL | Status: DC | PRN

## 2021-05-06 MED ORDER — OXYTOCIN-SODIUM CHLORIDE 30-0.9 UT/500ML-% IV SOLN
INTRAVENOUS | Status: AC
  Filled 2021-05-06: qty 500

## 2021-05-06 MED ORDER — OXYCODONE HCL 5 MG PO TABS: 5.0000 mg | ORAL_TABLET | ORAL | Status: DC | PRN

## 2021-05-06 MED ORDER — ENOXAPARIN SODIUM 40 MG/0.4ML IJ SOSY
40.0000 mg | PREFILLED_SYRINGE | INTRAMUSCULAR | Status: DC
  Administered 2021-05-07 – 2021-05-08 (×2): 40 mg via SUBCUTANEOUS
  Filled 2021-05-06 (×2): qty 0.4

## 2021-05-06 MED ORDER — KETOROLAC TROMETHAMINE 30 MG/ML IJ SOLN
INTRAMUSCULAR | Status: AC
  Filled 2021-05-06: qty 1

## 2021-05-06 MED ORDER — SOD CITRATE-CITRIC ACID 500-334 MG/5ML PO SOLN
30.0000 mL | Freq: Once | ORAL | Status: AC
  Administered 2021-05-06: 30 mL via ORAL

## 2021-05-06 MED ORDER — FENTANYL CITRATE (PF) 100 MCG/2ML IJ SOLN: 25.0000 ug | INTRAMUSCULAR | Status: DC | PRN

## 2021-05-06 MED ORDER — CLINDAMYCIN PHOSPHATE 900 MG/50ML IV SOLN
INTRAVENOUS | Status: AC
  Filled 2021-05-06: qty 50

## 2021-05-06 MED ORDER — CHLORHEXIDINE GLUCONATE 0.12 % MT SOLN
15.0000 mL | Freq: Once | OROMUCOSAL | Status: AC
  Administered 2021-05-06: 15 mL via OROMUCOSAL

## 2021-05-06 MED ORDER — NALOXONE HCL 0.4 MG/ML IJ SOLN: 0.4000 mg | INTRAMUSCULAR | Status: DC | PRN

## 2021-05-06 MED ORDER — SIMETHICONE 80 MG PO CHEW: 80.0000 mg | CHEWABLE_TABLET | ORAL | Status: DC | PRN

## 2021-05-06 MED ORDER — OXYTOCIN-SODIUM CHLORIDE 30-0.9 UT/500ML-% IV SOLN
INTRAVENOUS | Status: DC | PRN
  Administered 2021-05-06: 300 mL via INTRAVENOUS

## 2021-05-06 MED ORDER — SOD CITRATE-CITRIC ACID 500-334 MG/5ML PO SOLN
ORAL | Status: AC
  Filled 2021-05-06: qty 30

## 2021-05-06 MED ORDER — ONDANSETRON HCL 4 MG/2ML IJ SOLN
INTRAMUSCULAR | Status: DC | PRN
  Administered 2021-05-06: 4 mg via INTRAVENOUS

## 2021-05-06 MED ORDER — BUPIVACAINE IN DEXTROSE 0.75-8.25 % IT SOLN
INTRATHECAL | Status: DC | PRN
  Administered 2021-05-06: 1.6 mL via INTRATHECAL

## 2021-05-06 MED ORDER — CHLORHEXIDINE GLUCONATE 0.12 % MT SOLN
OROMUCOSAL | Status: AC
  Filled 2021-05-06: qty 15

## 2021-05-06 MED ORDER — ACETAMINOPHEN 500 MG PO TABS
1000.0000 mg | ORAL_TABLET | Freq: Four times a day (QID) | ORAL | Status: DC
  Administered 2021-05-06 – 2021-05-08 (×7): 1000 mg via ORAL
  Filled 2021-05-06 (×7): qty 2

## 2021-05-06 MED ORDER — SCOPOLAMINE 1 MG/3DAYS TD PT72: 1.0000 | MEDICATED_PATCH | Freq: Once | TRANSDERMAL | Status: DC

## 2021-05-06 MED ORDER — KETOROLAC TROMETHAMINE 30 MG/ML IJ SOLN: 30.0000 mg | Freq: Four times a day (QID) | INTRAMUSCULAR | Status: AC | PRN

## 2021-05-06 MED ORDER — METHYLERGONOVINE MALEATE 0.2 MG/ML IJ SOLN
INTRAMUSCULAR | Status: DC | PRN
  Administered 2021-05-06: .2 mg via INTRAMUSCULAR

## 2021-05-06 MED ORDER — ORAL CARE MOUTH RINSE: 15.0000 mL | Freq: Once | OROMUCOSAL | Status: AC

## 2021-05-06 MED ORDER — PHENYLEPHRINE 40 MCG/ML (10ML) SYRINGE FOR IV PUSH (FOR BLOOD PRESSURE SUPPORT)
PREFILLED_SYRINGE | INTRAVENOUS | Status: AC
  Filled 2021-05-06: qty 10

## 2021-05-06 MED ORDER — CLINDAMYCIN PHOSPHATE 900 MG/50ML IV SOLN
900.0000 mg | INTRAVENOUS | Status: AC
  Administered 2021-05-06: 900 mg via INTRAVENOUS

## 2021-05-06 MED ORDER — PHENYLEPHRINE HCL-NACL 20-0.9 MG/250ML-% IV SOLN
INTRAVENOUS | Status: DC | PRN
  Administered 2021-05-06: 60 ug/min via INTRAVENOUS

## 2021-05-06 MED ORDER — PREPLUS 27-1 MG PO TABS: 1.0000 | ORAL_TABLET | Freq: Every day | ORAL | Status: DC

## 2021-05-06 MED ORDER — PROMETHAZINE HCL 25 MG/ML IJ SOLN
6.2500 mg | INTRAMUSCULAR | Status: DC | PRN
  Administered 2021-05-06: 12.5 mg via INTRAVENOUS

## 2021-05-06 MED ORDER — OXYCODONE HCL 5 MG PO TABS
5.0000 mg | ORAL_TABLET | Freq: Once | ORAL | Status: DC | PRN
Start: 2021-05-06 — End: 2021-05-06

## 2021-05-06 MED ORDER — WITCH HAZEL-GLYCERIN EX PADS: 1.0000 "application " | MEDICATED_PAD | CUTANEOUS | Status: DC | PRN

## 2021-05-06 MED ORDER — MEPERIDINE HCL 25 MG/ML IJ SOLN: 6.2500 mg | INTRAMUSCULAR | Status: DC | PRN

## 2021-05-06 MED ORDER — EPHEDRINE SULFATE-NACL 50-0.9 MG/10ML-% IV SOSY
PREFILLED_SYRINGE | INTRAVENOUS | Status: DC | PRN
  Administered 2021-05-06: 10 mg via INTRAVENOUS

## 2021-05-06 MED ORDER — MORPHINE SULFATE (PF) 0.5 MG/ML IJ SOLN
INTRAMUSCULAR | Status: DC | PRN
  Administered 2021-05-06: .15 mg via INTRATHECAL

## 2021-05-06 MED ORDER — MORPHINE SULFATE (PF) 0.5 MG/ML IJ SOLN
INTRAMUSCULAR | Status: AC
  Filled 2021-05-06: qty 10

## 2021-05-06 MED ORDER — DIPHENHYDRAMINE HCL 25 MG PO CAPS: 25.0000 mg | ORAL_CAPSULE | Freq: Four times a day (QID) | ORAL | Status: DC | PRN

## 2021-05-06 MED ORDER — FENTANYL CITRATE (PF) 100 MCG/2ML IJ SOLN
INTRAMUSCULAR | Status: DC | PRN
  Administered 2021-05-06: 15 ug via INTRATHECAL

## 2021-05-06 MED ORDER — NALOXONE HCL 4 MG/10ML IJ SOLN
1.0000 ug/kg/h | INTRAVENOUS | Status: DC | PRN
  Filled 2021-05-06: qty 5

## 2021-05-06 MED ORDER — KETOROLAC TROMETHAMINE 30 MG/ML IJ SOLN
30.0000 mg | Freq: Four times a day (QID) | INTRAMUSCULAR | Status: AC | PRN
  Administered 2021-05-06 (×3): 30 mg via INTRAVENOUS
  Filled 2021-05-06 (×2): qty 1

## 2021-05-06 MED ORDER — SENNOSIDES-DOCUSATE SODIUM 8.6-50 MG PO TABS
2.0000 | ORAL_TABLET | Freq: Every day | ORAL | Status: DC
  Administered 2021-05-07 – 2021-05-08 (×2): 2 via ORAL
  Filled 2021-05-06 (×2): qty 2

## 2021-05-06 MED ORDER — PRENATAL MULTIVITAMIN CH
1.0000 | ORAL_TABLET | Freq: Every day | ORAL | Status: DC
  Administered 2021-05-07 – 2021-05-08 (×2): 1 via ORAL
  Filled 2021-05-06 (×2): qty 1

## 2021-05-06 MED ORDER — IBUPROFEN 800 MG PO TABS
800.0000 mg | ORAL_TABLET | Freq: Three times a day (TID) | ORAL | Status: DC
  Administered 2021-05-07 – 2021-05-08 (×5): 800 mg via ORAL
  Filled 2021-05-06 (×5): qty 1

## 2021-05-06 MED ORDER — GENTAMICIN SULFATE 40 MG/ML IJ SOLN
5.0000 mg/kg | INTRAVENOUS | Status: AC
  Administered 2021-05-06: 285.6 mg via INTRAVENOUS
  Filled 2021-05-06: qty 7.25

## 2021-05-06 MED ORDER — SCOPOLAMINE 1 MG/3DAYS TD PT72
1.0000 | MEDICATED_PATCH | Freq: Once | TRANSDERMAL | Status: DC
  Administered 2021-05-06: 1.5 mg via TRANSDERMAL

## 2021-05-06 MED ORDER — EPHEDRINE 5 MG/ML INJ
INTRAVENOUS | Status: AC
  Filled 2021-05-06: qty 10

## 2021-05-06 MED ORDER — TETANUS-DIPHTH-ACELL PERTUSSIS 5-2.5-18.5 LF-MCG/0.5 IM SUSY: 0.5000 mL | PREFILLED_SYRINGE | Freq: Once | INTRAMUSCULAR | Status: DC

## 2021-05-06 MED ORDER — NALBUPHINE HCL 10 MG/ML IJ SOLN: 5.0000 mg | INTRAMUSCULAR | Status: DC | PRN

## 2021-05-06 MED ORDER — ONDANSETRON HCL 4 MG/2ML IJ SOLN: 4.0000 mg | Freq: Three times a day (TID) | INTRAMUSCULAR | Status: DC | PRN

## 2021-05-06 MED ORDER — ZOLPIDEM TARTRATE 5 MG PO TABS: 5.0000 mg | ORAL_TABLET | Freq: Every evening | ORAL | Status: DC | PRN

## 2021-05-06 MED ORDER — POVIDONE-IODINE 10 % EX SWAB
2.0000 "application " | Freq: Once | CUTANEOUS | Status: AC
  Administered 2021-05-06: 2 via TOPICAL

## 2021-05-06 MED ORDER — OXYCODONE HCL 5 MG/5ML PO SOLN: 5.0000 mg | Freq: Once | ORAL | Status: DC | PRN

## 2021-05-06 MED ORDER — NALBUPHINE HCL 10 MG/ML IJ SOLN: 5.0000 mg | Freq: Once | INTRAMUSCULAR | Status: DC | PRN

## 2021-05-06 MED ORDER — OXYTOCIN-SODIUM CHLORIDE 30-0.9 UT/500ML-% IV SOLN: 2.5000 [IU]/h | INTRAVENOUS | Status: AC

## 2021-05-06 MED ORDER — METHYLERGONOVINE MALEATE 0.2 MG/ML IJ SOLN
INTRAMUSCULAR | Status: AC
  Filled 2021-05-06: qty 1

## 2021-05-06 MED ORDER — PROMETHAZINE HCL 25 MG/ML IJ SOLN
INTRAMUSCULAR | Status: AC
  Filled 2021-05-06: qty 1

## 2021-05-06 MED ORDER — DEXAMETHASONE SODIUM PHOSPHATE 10 MG/ML IJ SOLN
INTRAMUSCULAR | Status: AC
  Filled 2021-05-06: qty 1

## 2021-05-06 MED ORDER — DIPHENHYDRAMINE HCL 50 MG/ML IJ SOLN: 12.5000 mg | INTRAMUSCULAR | Status: DC | PRN

## 2021-05-06 MED ORDER — FAMOTIDINE 20 MG PO TABS
20.0000 mg | ORAL_TABLET | Freq: Once | ORAL | Status: AC
  Administered 2021-05-06: 20 mg via ORAL

## 2021-05-06 MED ORDER — PHENYLEPHRINE HCL-NACL 20-0.9 MG/250ML-% IV SOLN
INTRAVENOUS | Status: AC
  Filled 2021-05-06: qty 250

## 2021-05-06 MED ORDER — DEXAMETHASONE SODIUM PHOSPHATE 10 MG/ML IJ SOLN
INTRAMUSCULAR | Status: DC | PRN
  Administered 2021-05-06: 10 mg via INTRAVENOUS

## 2021-05-06 MED ORDER — DIPHENHYDRAMINE HCL 25 MG PO CAPS: 25.0000 mg | ORAL_CAPSULE | ORAL | Status: DC | PRN

## 2021-05-06 MED ORDER — FENTANYL CITRATE (PF) 100 MCG/2ML IJ SOLN
INTRAMUSCULAR | Status: AC
  Filled 2021-05-06: qty 2

## 2021-05-06 MED ORDER — DIBUCAINE (PERIANAL) 1 % EX OINT: 1.0000 "application " | TOPICAL_OINTMENT | CUTANEOUS | Status: DC | PRN

## 2021-05-06 MED ORDER — SIMETHICONE 80 MG PO CHEW
80.0000 mg | CHEWABLE_TABLET | Freq: Three times a day (TID) | ORAL | Status: DC
  Administered 2021-05-06 – 2021-05-08 (×6): 80 mg via ORAL
  Filled 2021-05-06 (×6): qty 1

## 2021-05-06 MED ORDER — FAMOTIDINE 20 MG PO TABS
ORAL_TABLET | ORAL | Status: AC
  Filled 2021-05-06: qty 1

## 2021-05-06 MED ORDER — SCOPOLAMINE 1 MG/3DAYS TD PT72
MEDICATED_PATCH | TRANSDERMAL | Status: AC
  Filled 2021-05-06: qty 1

## 2021-05-06 SURGICAL SUPPLY — 37 items
CHLORAPREP W/TINT 26ML (MISCELLANEOUS) ×2 IMPLANT
CLAMP CORD UMBIL (MISCELLANEOUS) IMPLANT
CLOTH BEACON ORANGE TIMEOUT ST (SAFETY) ×2 IMPLANT
DERMABOND ADVANCED (GAUZE/BANDAGES/DRESSINGS) ×1
DERMABOND ADVANCED .7 DNX12 (GAUZE/BANDAGES/DRESSINGS) ×1 IMPLANT
DRSG OPSITE POSTOP 4X10 (GAUZE/BANDAGES/DRESSINGS) ×2 IMPLANT
ELECT REM PT RETURN 9FT ADLT (ELECTROSURGICAL) ×2
ELECTRODE REM PT RTRN 9FT ADLT (ELECTROSURGICAL) ×1 IMPLANT
EXTRACTOR VACUUM BELL STYLE (SUCTIONS) IMPLANT
GLOVE BIOGEL PI IND STRL 7.0 (GLOVE) ×1 IMPLANT
GLOVE BIOGEL PI IND STRL 8 (GLOVE) ×1 IMPLANT
GLOVE BIOGEL PI INDICATOR 7.0 (GLOVE) ×1
GLOVE BIOGEL PI INDICATOR 8 (GLOVE) ×1
GLOVE ECLIPSE 8.0 STRL XLNG CF (GLOVE) ×2 IMPLANT
GOWN STRL REUS W/TWL LRG LVL3 (GOWN DISPOSABLE) ×4 IMPLANT
KIT ABG SYR 3ML LUER SLIP (SYRINGE) ×2 IMPLANT
NEEDLE HYPO 18GX1.5 BLUNT FILL (NEEDLE) ×2 IMPLANT
NEEDLE HYPO 22GX1.5 SAFETY (NEEDLE) ×2 IMPLANT
NEEDLE HYPO 25X5/8 SAFETYGLIDE (NEEDLE) ×2 IMPLANT
NS IRRIG 1000ML POUR BTL (IV SOLUTION) ×2 IMPLANT
PACK C SECTION WH (CUSTOM PROCEDURE TRAY) ×2 IMPLANT
PAD OB MATERNITY 4.3X12.25 (PERSONAL CARE ITEMS) ×2 IMPLANT
PENCIL SMOKE EVAC W/HOLSTER (ELECTROSURGICAL) ×2 IMPLANT
RTRCTR C-SECT PINK 25CM LRG (MISCELLANEOUS) IMPLANT
SUT CHROMIC 0 CT 1 (SUTURE) ×2 IMPLANT
SUT MNCRL 0 VIOLET CTX 36 (SUTURE) ×3 IMPLANT
SUT MONOCRYL 0 CTX 36 (SUTURE) ×3
SUT PLAIN 2 0 (SUTURE)
SUT PLAIN 2 0 XLH (SUTURE) IMPLANT
SUT PLAIN ABS 2-0 CT1 27XMFL (SUTURE) IMPLANT
SUT VIC AB 0 CTX 36 (SUTURE) ×1
SUT VIC AB 0 CTX36XBRD ANBCTRL (SUTURE) ×1 IMPLANT
SUT VIC AB 4-0 KS 27 (SUTURE) IMPLANT
SYR 20CC LL (SYRINGE) ×4 IMPLANT
TOWEL OR 17X24 6PK STRL BLUE (TOWEL DISPOSABLE) ×2 IMPLANT
TRAY FOLEY W/BAG SLVR 14FR LF (SET/KITS/TRAYS/PACK) IMPLANT
WATER STERILE IRR 1000ML POUR (IV SOLUTION) ×2 IMPLANT

## 2021-05-06 NOTE — Op Note (Signed)
Preoperative diagnosis:  1.  Intrauterine pregnancy at [redacted]w[redacted]d  weeks gestation                                         2.  Previous Caesarean section, declines trial of labor                                         3.     Postoperative diagnosis:  Same as above  Procedure:  Repeat cesarean section  Surgeon:  Lazaro Arms MD  Assistant:    Anesthesia: Spinal  Findings:  .    Over a low transverse incision was delivered a viable female with Apgars of 8 and 9 weighing  lbs.  oz. Uterus, tubes and ovaries were all normal.  There were no other significant findings  Description of operation:  Patient was taken to the operating room and placed in the sitting position where she underwent a spinal anesthetic. She was then placed in the supine position with tilt to the left side. When adequate anesthetic level was obtained she was prepped and draped in usual sterile fashion and a Foley catheter was placed. A Pfannenstiel skin incision was made and carried down sharply to the rectus fascia which was scored in the midline extended laterally. The fascia was taken off the muscles both superiorly and without difficulty. The muscles were divided.  The peritoneal cavity was entered.  Bladder blade was placed, no bladder flap was created.  A low transverse hysterotomy incision was made and delivered a viable female  infant at 11 with Apgars of 8 and 9 weighing lbs  oz.  Cord pH was not obtained. The uterus was exteriorized. It was closed in 2 layers, the first being a running interlocking layer and the second being an imbricating layer using 0 monocryl on a CTX needle. There was good resulting hemostasis. The uterus tubes and ovaries were all normal. Peritoneal cavity was irrigated vigorously. The muscles and peritoneum were reapproximated loosely. The fascia was closed using 0 Vicryl in running fashion. Subcutaneous tissue was made hemostatic and irrigated. The skin was closed using 4-0 Vicryl on a Keith needle in a  subcuticular fashion.  Dermabond was placed for additional wound integrity and to serve as a barrier. Blood loss for the procedure was 300 cc. The patient received a gram of Ancef prophylactically. The patient was taken to the recovery room in good stable condition with all counts being correct x3.  EBL 300 cc  Lazaro Arms 05/06/2021 8:15 AM

## 2021-05-06 NOTE — Anesthesia Procedure Notes (Signed)
Spinal  Patient location during procedure: OR Start time: 05/06/2021 7:24 AM End time: 05/06/2021 7:29 AM Reason for block: surgical anesthesia Staffing Performed: anesthesiologist  Anesthesiologist: Beryle Lathe, MD Preanesthetic Checklist Completed: patient identified, IV checked, risks and benefits discussed, surgical consent, monitors and equipment checked, pre-op evaluation and timeout performed Spinal Block Patient position: sitting Prep: DuraPrep Patient monitoring: heart rate, cardiac monitor, continuous pulse ox and blood pressure Approach: midline Location: L3-4 Injection technique: single-shot Needle Needle type: Pencan  Needle gauge: 24 G Additional Notes Consent was obtained prior to the procedure with all questions answered and concerns addressed. Risks including, but not limited to, bleeding, infection, nerve damage, paralysis, failed block, inadequate analgesia, allergic reaction, high spinal, itching, and headache were discussed and the patient wished to proceed. Functioning IV was confirmed and monitors were applied. Sterile prep and drape, including hand hygiene, mask, and sterile gloves were used. The patient was positioned and the spine was prepped. The skin was anesthetized with lidocaine. Free flow of clear CSF was obtained prior to injecting local anesthetic into the CSF. The spinal needle aspirated freely following injection. The needle was carefully withdrawn. The patient tolerated the procedure well.   Leslye Peer, MD

## 2021-05-06 NOTE — Anesthesia Preprocedure Evaluation (Addendum)
Anesthesia Evaluation  Patient identified by MRN, date of birth, ID band Patient awake    Reviewed: Allergy & Precautions, NPO status , Patient's Chart, lab work & pertinent test results  History of Anesthesia Complications Negative for: history of anesthetic complications  Airway Mallampati: II   Neck ROM: Full    Dental   Pulmonary neg pulmonary ROS,    Pulmonary exam normal        Cardiovascular negative cardio ROS Normal cardiovascular exam     Neuro/Psych negative neurological ROS  negative psych ROS   GI/Hepatic negative GI ROS, Neg liver ROS,   Endo/Other  diabetes (prior pregnancy), Gestational  Renal/GU negative Renal ROS     Musculoskeletal negative musculoskeletal ROS (+)   Abdominal   Peds  Hematology  (+) anemia ,  Plt 163k    Anesthesia Other Findings Covid test negative   Reproductive/Obstetrics (+) Pregnancy                            Anesthesia Physical Anesthesia Plan  ASA: II  Anesthesia Plan: Spinal   Post-op Pain Management:    Induction:   PONV Risk Score and Plan: 2 and Treatment may vary due to age or medical condition, Ondansetron and Scopolamine patch - Pre-op  Airway Management Planned: Natural Airway  Additional Equipment: None  Intra-op Plan:   Post-operative Plan:   Informed Consent: I have reviewed the patients History and Physical, chart, labs and discussed the procedure including the risks, benefits and alternatives for the proposed anesthesia with the patient or authorized representative who has indicated his/her understanding and acceptance.       Plan Discussed with: CRNA and Anesthesiologist  Anesthesia Plan Comments: (Labs reviewed, platelets acceptable. Discussed risks and benefits of spinal, including spinal/epidural hematoma, infection, failed block, and PDPH. Patient expressed understanding and wished to proceed. )         Anesthesia Quick Evaluation

## 2021-05-06 NOTE — Anesthesia Postprocedure Evaluation (Signed)
Anesthesia Post Note  Patient: Joy Hicks  Procedure(s) Performed: REPEAT CESAREAN SECTION (N/A )     Patient location during evaluation: PACU Anesthesia Type: Spinal Level of consciousness: awake and alert Pain management: pain level controlled Vital Signs Assessment: post-procedure vital signs reviewed and stable Respiratory status: spontaneous breathing and respiratory function stable Cardiovascular status: blood pressure returned to baseline and stable Postop Assessment: spinal receding and no apparent nausea or vomiting Anesthetic complications: no   No complications documented.  Last Vitals:  Vitals:   05/06/21 0915 05/06/21 0930  BP: 136/87 138/82  Pulse: 79 72  Resp: (!) 21 18  Temp:  36.6 C  SpO2: 99% 99%    Last Pain:  Vitals:   05/06/21 0930  TempSrc:   PainSc: 0-No pain   Pain Goal: Patients Stated Pain Goal: 1 (05/06/21 0545)  LLE Motor Response: Purposeful movement (05/06/21 0915)   RLE Motor Response: Purposeful movement (05/06/21 0915)       Epidural/Spinal Function Cutaneous sensation: Able to Discern Pressure (05/06/21 0915), Patient able to flex knees: Yes (05/06/21 0915), Patient able to lift hips off bed: No (05/06/21 0915), Back pain beyond tenderness at insertion site: No (05/06/21 0915), Progressively worsening motor and/or sensory loss: No (05/06/21 0915), Bowel and/or bladder incontinence post epidural: No (05/06/21 0915)  Beryle Lathe

## 2021-05-06 NOTE — Transfer of Care (Signed)
Immediate Anesthesia Transfer of Care Note  Patient: Joy Hicks  Procedure(s) Performed: REPEAT CESAREAN SECTION (N/A )  Patient Location: PACU  Anesthesia Type:Spinal  Level of Consciousness: awake  Airway & Oxygen Therapy: Patient Spontanous Breathing  Post-op Assessment: Report given to RN  Post vital signs: Reviewed and stable  Last Vitals:  Vitals Value Taken Time  BP 112/70 05/06/21 0830  Temp    Pulse 67 05/06/21 0830  Resp 22 05/06/21 0830  SpO2 99 % 05/06/21 0830  Vitals shown include unvalidated device data.  Last Pain:  Vitals:   05/06/21 0545  TempSrc: Oral      Patients Stated Pain Goal: 1 (05/06/21 0545)  Complications: No complications documented.

## 2021-05-06 NOTE — H&P (Signed)
Preoperative History and Physical  Joy Hicks is a 25 y.o. G2P1001 Estimated Date of Delivery: 05/13/21 [redacted]w[redacted]d  with Patient's last menstrual period was 07/29/2020 (exact date). admitted for a repeat Caesarean section.    PMH:    Past Medical History:  Diagnosis Date  . Eczema   . Gestational diabetes   . Medical history non-contributory     PSH:     Past Surgical History:  Procedure Laterality Date  . CESAREAN SECTION N/A 11/29/2019   Procedure: CESAREAN SECTION;  Surgeon: Lazaro Arms, MD;  Location: [facility] LD ORS;  Service: Obstetrics;  Laterality: N/A;  . MOUTH SURGERY      POb/GynH:      OB History    Gravida  2   Para  1   Term  1   Preterm  0   AB  0   Living  1     SAB  0   IAB  0   Ectopic  0   Multiple  0   Live Births  1           SH:   Social History   Tobacco Use  . Smoking status: Never Smoker  . Smokeless tobacco: Never Used  Vaping Use  . Vaping Use: Never used  Substance Use Topics  . Alcohol use: No  . Drug use: No    FH:    Family History  Problem Relation Age of Onset  . Congestive Heart Failure Maternal Grandmother   . Diabetes Maternal Grandfather   . Hypertension Father   . High Cholesterol Father   . Hypertension Mother   . High Cholesterol Mother      Allergies:  Allergies  Allergen Reactions  . Ancef [Cefazolin] Hives, Shortness Of Breath and Rash  . Nickel Rash    Medications:       Current Facility-Administered Medications:  .  clindamycin (CLEOCIN) IVPB 900 mg, 900 mg, Intravenous, 60 min Pre-Op **AND** gentamicin (GARAMYCIN) 290 mg in dextrose 5 % 100 mL IVPB, 5 mg/kg (Adjusted), Intravenous, 60 min Pre-Op, Saleh Ulbrich, Amaryllis Dyke, MD .  lactated ringers infusion, , Intravenous, Continuous, Lazaro Arms, MD, Last Rate: 125 mL/hr at 05/06/21 0656, Continued from Pre-op at 05/06/21 0656  Review of Systems:   Review of Systems  Constitutional: Negative for fever, chills, weight loss, malaise/fatigue and  diaphoresis.  HENT: Negative for hearing loss, ear pain, nosebleeds, congestion, sore throat, neck pain, tinnitus and ear discharge.   Eyes: Negative for blurred vision, double vision, photophobia, pain, discharge and redness.  Respiratory: Negative for cough, hemoptysis, sputum production, shortness of breath, wheezing and stridor.   Cardiovascular: Negative for chest pain, palpitations, orthopnea, claudication, leg swelling and PND.  Gastrointestinal: Positive for abdominal pain. Negative for heartburn, nausea, vomiting, diarrhea, constipation, blood in stool and melena.  Genitourinary: Negative for dysuria, urgency, frequency, hematuria and flank pain.  Musculoskeletal: Negative for myalgias, back pain, joint pain and falls.  Skin: Negative for itching and rash.  Neurological: Negative for dizziness, tingling, tremors, sensory change, speech change, focal weakness, seizures, loss of consciousness, weakness and headaches.  Endo/Heme/Allergies: Negative for environmental allergies and polydipsia. Does not bruise/bleed easily.  Psychiatric/Behavioral: Negative for depression, suicidal ideas, hallucinations, memory loss and substance abuse. The patient is not nervous/anxious and does not have insomnia.      PHYSICAL EXAM:  Blood pressure 116/71, pulse 80, temperature 98.2 F (36.8 C), temperature source Oral, resp. rate 20, height 5\' 2"  (1.575 m), weight 68  kg, last menstrual period 07/29/2020, not currently breastfeeding.    Vitals reviewed. Constitutional: She is oriented to person, place, and time. She appears well-developed and well-nourished.  HENT:  Head: Normocephalic and atraumatic.  Right Ear: External ear normal.  Left Ear: External ear normal.  Nose: Nose normal.  Mouth/Throat: Oropharynx is clear and moist.  Eyes: Conjunctivae and EOM are normal. Pupils are equal, round, and reactive to light. Right eye exhibits no discharge. Left eye exhibits no discharge. No scleral icterus.   Neck: Normal range of motion. Neck supple. No tracheal deviation present. No thyromegaly present.  Cardiovascular: Normal rate, regular rhythm, normal heart sounds and intact distal pulses.  Exam reveals no gallop and no friction rub.   No murmur heard. Respiratory: Effort normal and breath sounds normal. No respiratory distress. She has no wheezes. She has no rales. She exhibits no tenderness.  GI: Soft. Bowel sounds are normal. She exhibits no distension and no mass. There is tenderness. There is no rebound and no guarding.  Genitourinary:       Vulva is normal without lesions Vagina is pink moist without discharge Cervix normal in appearance and pap is normal Uterus is enlarged, 39 weeks size Adnexa is negative with normal sized ovaries by sonogram  Musculoskeletal: Normal range of motion. She exhibits no edema and no tenderness.  Neurological: She is alert and oriented to person, place, and time. She has normal reflexes. She displays normal reflexes. No cranial nerve deficit. She exhibits normal muscle tone. Coordination normal.  Skin: Skin is warm and dry. No rash noted. No erythema. No pallor.  Psychiatric: She has a normal mood and affect. Her behavior is normal. Judgment and thought content normal.    Labs: Results for orders placed or performed during the hospital encounter of 05/03/21 (from the past 336 hour(s))  SARS CORONAVIRUS 2 (TAT 6-24 HRS) Nasopharyngeal Nasopharyngeal Swab   Collection Time: 05/03/21 10:25 AM   Specimen: Nasopharyngeal Swab  Result Value Ref Range   SARS Coronavirus 2 NEGATIVE NEGATIVE  Results for orders placed or performed during the hospital encounter of 05/03/21 (from the past 336 hour(s))  Type and screen   Collection Time: 05/03/21  9:42 AM  Result Value Ref Range   ABO/RH(D) O POS    Antibody Screen NEG    Sample Expiration      05/06/2021,2359 Performed at [facility], [address] [place], [place] [address]   CBC   Collection  Time: 05/03/21 10:00 AM  Result Value Ref Range   WBC 5.9 4.0 - 10.5 K/uL   RBC 3.62 (L) 3.87 - 5.11 MIL/uL   Hemoglobin 9.4 (L) 12.0 - 15.0 g/dL   HCT 60.1 (L) 09.3 - 23.5 %   MCV 82.9 80.0 - 100.0 fL   MCH 26.0 26.0 - 34.0 pg   MCHC 31.3 30.0 - 36.0 g/dL   RDW 57.3 22.0 - 25.4 %   Platelets 163 150 - 400 K/uL   nRBC 0.0 0.0 - 0.2 %  RPR   Collection Time: 05/03/21 10:00 AM  Result Value Ref Range   RPR Ser Ql NON REACTIVE NON REACTIVE    EKG: No orders found for this or any previous visit.  Imaging Studies: No results found.    Assessment: [redacted]w[redacted]d Estimated Date of Delivery: 05/13/21  G2P1001 Previous Caesarean section, desires repeat  Patient Active Problem List   Diagnosis Date Noted  . Previous cesarean section 05/06/2021  . History of gestational diabetes in prior pregnancy,  currently pregnant 10/30/2020  . Encounter for supervision of normal pregnancy, antepartum 10/29/2020  . History of cesarean delivery 10/29/2020  . Allergic reaction 11/29/2019    Plan: Repeat Caesarean section  Lazaro Arms 05/06/2021 6:56 AM

## 2021-05-07 LAB — CBC
HCT: 26.7 % — ABNORMAL LOW (ref 36.0–46.0)
Hemoglobin: 8.7 g/dL — ABNORMAL LOW (ref 12.0–15.0)
MCH: 26.5 pg (ref 26.0–34.0)
MCHC: 32.6 g/dL (ref 30.0–36.0)
MCV: 81.4 fL (ref 80.0–100.0)
Platelets: 137 10*3/uL — ABNORMAL LOW (ref 150–400)
RBC: 3.28 MIL/uL — ABNORMAL LOW (ref 3.87–5.11)
RDW: 13.1 % (ref 11.5–15.5)
WBC: 7.7 10*3/uL (ref 4.0–10.5)
nRBC: 0 % (ref 0.0–0.2)

## 2021-05-07 LAB — BIRTH TISSUE RECOVERY COLLECTION (PLACENTA DONATION)

## 2021-05-07 MED ORDER — SODIUM CHLORIDE 0.9 % IV SOLN
500.0000 mg | Freq: Once | INTRAVENOUS | Status: AC
  Administered 2021-05-07: 500 mg via INTRAVENOUS
  Filled 2021-05-07: qty 25

## 2021-05-07 NOTE — Progress Notes (Addendum)
Post-Op Day 1, repeat CS for elective  Subjective: No complaints, up ad lib, voiding and tolerating PO, passing flatus,small lochia,.plans to bottle feed, oral contraceptives (estrogen/progesterone)  Objective: Blood pressure (!) 101/54, pulse (!) 52, temperature 97.9 F (36.6 C), temperature source Oral, resp. rate 18, height 5\' 2"  (1.575 m), weight 68 kg, last menstrual period 07/29/2020, SpO2 98 %, unknown if currently breastfeeding.  Physical Exam:  General: alert, cooperative and no distress Lochia:normal flow Chest: CTAB Heart: RRR no m/r/g Abdomen: +BS, soft, nontender, dsg c/d/intact, no erythema Uterine Fundus: firm DVT Evaluation: No evidence of DVT seen on physical exam. Extremities: no edema  Recent Labs    05/07/21 0545  HGB 8.7*  HCT 26.7*    Assessment/Plan:   Anemia:  IV Venofer Circumcision prior to discharge  Circumcision Consent  Discussed with mom at bedside about circumcision.   Circumcision is a surgery that removes the skin that covers the tip of the penis, called the "foreskin." Circumcision is usually done when a boy is between 24 and 41 days old, sometimes up to 49-68 weeks old.  The most common reasons boys are circumcised include for cultural/religious beliefs or for parental preference (potentially easier to clean, so baby looks like daddy, etc).  There may be some medical benefits for circumcision:   Circumcised boys seem to have slightly lower rates of: ? Urinary tract infections (per the American Academy of Pediatrics an uncircumcised boy has a 1/100 chance of developing a UTI in the first year of life, a circumcised boy at a 12/998 chance of developing a UTI in the first year of life- a 10% reduction) ? Penis cancer (typically rare- an uncircumcised female has a 1 in 100,000 chance of developing cancer of the penis) ? Sexually transmitted infection (in endemic areas, including HIV, HPV and Herpes- circumcision does NOT protect against gonorrhea,  chlamydia, trachomatis, or syphilis) ? Phimosis: a condition where that makes retraction of the foreskin over the glans impossible (0.4 per 1000 boys per year or 0.6% of boys are affected by their 15th birthday)  Boys and men who are not circumcised can reduce these extra risks by: ? Cleaning their penis well ? Using condoms during sex  What are the risks of circumcision?  As with any surgical procedure, there are risks and complications. In circumcision, complications are rare and usually minor, the most common being: ? Bleeding- risk is reduced by holding each clamp for 30 seconds prior to a cut being made, and by holding pressure after the procedure is done ? Infection- the penis is cleaned prior to the procedure, and the procedure is done under sterile technique ? Damage to the urethra or amputation of the penis  How is circumcision done in baby boys?  The baby will be placed on a special table and the legs restrained for their safety. Numbing medication is injected into the penis, and the skin is cleansed with betadine to decrease the risk of infection.   What to expect:  The penis will look red and raw for 5-7 days as it heals. We expect scabbing around where the cut was made, as well as clear-pink fluid and some swelling of the penis right after the procedure. If your baby's circumcision starts to bleed or develops pus, please contact your pediatrician immediately.  All questions were answered and mother consented.  16 Obstetrics Fellow    LOS: 1 day   Jacklyn Shell 05/07/2021, 7:20 AM

## 2021-05-08 DIAGNOSIS — Z98891 History of uterine scar from previous surgery: Secondary | ICD-10-CM

## 2021-05-08 MED ORDER — FERROUS SULFATE 325 (65 FE) MG PO TABS: 325.0000 mg | ORAL_TABLET | ORAL | 3 refills | Status: AC

## 2021-05-08 MED ORDER — FERROUS SULFATE 325 (65 FE) MG PO TABS
325.0000 mg | ORAL_TABLET | ORAL | Status: DC
  Administered 2021-05-08: 325 mg via ORAL
  Filled 2021-05-08: qty 1

## 2021-05-08 MED ORDER — IBUPROFEN 800 MG PO TABS: 800.0000 mg | ORAL_TABLET | Freq: Three times a day (TID) | ORAL | 0 refills | Status: DC

## 2021-05-08 MED ORDER — OXYCODONE HCL 5 MG PO TABS: 5.0000 mg | ORAL_TABLET | Freq: Four times a day (QID) | ORAL | 0 refills | Status: DC | PRN

## 2021-05-08 MED ORDER — ACETAMINOPHEN 500 MG PO TABS: 1000.0000 mg | ORAL_TABLET | Freq: Four times a day (QID) | ORAL | 0 refills | Status: DC

## 2021-05-08 NOTE — Progress Notes (Signed)
POSTPARTUM PROGRESS NOTE  POD2 s/p elective rLTCS  Subjective:  Joy Hicks is a 25 y.o. [id number] s/p rLTCS at [redacted]w[redacted]d.  No acute events overnight.  Pt denies problems with ambulating, voiding or po intake.  She denies nausea or vomiting.  Pain is well controlled.  She has had flatus. She has had bowel movement.  Lochia Minimal.    Objective: Blood pressure 110/66, pulse 64, temperature 98.1 F (36.7 C), temperature source Oral, resp. rate 18, height 5\' 2"  (1.575 m), weight 68 kg, last menstrual period 07/29/2020, SpO2 100 %, unknown if currently breastfeeding.  Physical Exam:  General: alert, cooperative and no distress Chest: no respiratory distress Heart:regular rate Abdomen: soft, nontender,  Uterine Fundus: firm, nontender DVT Evaluation: No calf swelling or tenderness Extremities: no edema Skin: warm, dry; honeycomb clean/dry/intact  Recent Labs    05/07/21 0545  HGB 8.7*  HCT 26.7*    Assessment/Plan: Joy Hicks is a 25 y.o. [phone] s/p elective rLTCS at [redacted]w[redacted]d   POD#2 - Doing well Contraception: OCPs Feeding: bottle Dispo: Plan for discharge possibly today pending baby (may need to work on weight gain/feeding).   LOS: 2 days   [redacted]w[redacted]d, MD 05/08/2021, 8:15 AM

## 2021-05-08 NOTE — Discharge Summary (Signed)
Postpartum Discharge Summary     Patient Name: Joy Hicks DOB: 03/20/1996 MRN: [id number]  Date of admission: 05/06/2021 Delivery date:05/06/2021  Delivering provider: Florian Buff  Date of discharge: 05/08/2021  Admitting diagnosis: Previous cesarean section [Z98.891] Intrauterine pregnancy: [redacted]w[redacted]d    Secondary diagnosis:  Active Problems:   Encounter for supervision of normal pregnancy, antepartum   History of cesarean delivery   History of gestational diabetes in prior pregnancy, currently pregnant   Previous cesarean section  Additional problems: n/a    Discharge diagnosis: Term Pregnancy Delivered                                              Post partum procedures:IV Venofer Augmentation: N/A Complications: None  Hospital course: Sceduled C/S   25y.o. yo G2P2002 at 369w0das admitted to the hospital 05/06/2021 for scheduled cesarean section with the following indication:Elective Repeat.Delivery details are as follows:  Membrane Rupture Time/Date: 7:50 AM ,05/06/2021   Delivery Method:C-Section, Low Transverse  Details of operation can be found in separate operative note.  Patient had an uncomplicated postpartum course.  She is ambulating, tolerating a regular diet, passing flatus, and urinating well. Patient is discharged home in stable condition on  05/08/21        Newborn Data: Birth date:05/06/2021  Birth time:7:50 AM  Gender:Female  Living status:Living  Apgars:8 ,8  Weight:3365 g     Magnesium Sulfate received: No BMZ received: No Rhophylac:N/A MMR:No; immune T-DaP:Given prenatally Flu: No; declined Transfusion:No  Physical exam  Vitals:   05/07/21 1343 05/07/21 2108 05/08/21 0600 05/08/21 1311  BP: 110/64 (!) 93/59 110/66 111/68  Pulse: 65 67 64 74  Resp: 18 17 18 17   Temp: 97.9 F (36.6 C) 98.1 F (36.7 C) 98.1 F (36.7 C) 98.2 F (36.8 C)  TempSrc: Oral Oral Oral Oral  SpO2: 98% 100% 100%   Weight:      Height:       General: alert,  cooperative and no distress Lochia: appropriate Uterine Fundus: firm Incision: Healing well with no significant drainage, No significant erythema, Dressing is clean, dry, and intact DVT Evaluation: No evidence of DVT seen on physical exam. Labs: Lab Results  Component Value Date   WBC 7.7 05/07/2021   HGB 8.7 (L) 05/07/2021   HCT 26.7 (L) 05/07/2021   MCV 81.4 05/07/2021   PLT 137 (L) 05/07/2021   CMP Latest Ref Rng & Units 11/30/2019  Creatinine 0.44 - 1.00 mg/dL 0.45   Edinburgh Score: Edinburgh Postnatal Depression Scale Screening Tool 05/08/2021  I have been able to laugh and see the funny side of things. 0  I have looked forward with enjoyment to things. 0  I have blamed myself unnecessarily when things went wrong. 0  I have been anxious or worried for no good reason. 0  I have felt scared or panicky for no good reason. 0  Things have been getting on top of me. 0  I have been so unhappy that I have had difficulty sleeping. 0  I have felt sad or miserable. 0  I have been so unhappy that I have been crying. 0  The thought of harming myself has occurred to me. 0  Edinburgh Postnatal Depression Scale Total 0    After visit meds:  Allergies as of 05/08/2021      Reactions  Ancef [cefazolin] Hives, Shortness Of Breath, Rash   Nickel Rash      Medication List    STOP taking these medications   PrePLUS 27-1 MG Tabs     TAKE these medications   acetaminophen 500 MG tablet Commonly known as: TYLENOL Take 2 tablets (1,000 mg total) by mouth every 6 (six) hours.   ferrous sulfate 325 (65 FE) MG tablet Take 1 tablet (325 mg total) by mouth every other day.   ibuprofen 800 MG tablet Commonly known as: ADVIL Take 1 tablet (800 mg total) by mouth every 8 (eight) hours.   oxyCODONE 5 MG immediate release tablet Commonly known as: Oxy IR/ROXICODONE Take 1 tablet (5 mg total) by mouth every 6 (six) hours as needed for severe pain or breakthrough pain.      Discharge  home in stable condition Infant Feeding: Bottle Infant Disposition:home with mother Discharge instruction: per After Visit Summary and Postpartum booklet. Activity: Advance as tolerated. Pelvic rest for 6 weeks.  Diet: routine diet Future Appointments: Future Appointments  Date Time Provider [place]  05/17/2021 12:50 PM Janyth Pupa, DO [facility]  06/10/2021  1:30 PM Roma Schanz, CNM [facility]   Follow up Visit:   Please schedule this patient for a Virtual postpartum visit in 6 weeks with the following provider: Any provider. Additional Postpartum F/U:Incision check 1 week  Low risk pregnancy complicated by: anemia Delivery mode:  C-Section, Low Transverse  Anticipated Birth Control:  [place], CNM  05/08/21 2:40 PM

## 2021-05-08 NOTE — Discharge Instructions (Signed)

## 2021-05-17 ENCOUNTER — Encounter: Payer: 59 | Admitting: Obstetrics & Gynecology

## 2021-05-21 ENCOUNTER — Encounter: Payer: Self-pay | Admitting: Women's Health

## 2021-05-21 ENCOUNTER — Ambulatory Visit (INDEPENDENT_AMBULATORY_CARE_PROVIDER_SITE_OTHER): Payer: 59 | Admitting: Women's Health

## 2021-05-21 ENCOUNTER — Other Ambulatory Visit: Payer: Self-pay

## 2021-05-21 VITALS — BP 114/69 | HR 80 | Ht 63.0 in | Wt 134.8 lb

## 2021-05-21 DIAGNOSIS — Z4889 Encounter for other specified surgical aftercare: Secondary | ICD-10-CM

## 2021-05-21 NOTE — Progress Notes (Signed)
   GYN VISIT Patient name: Joy Hicks MRN [id number]  Date of birth: 11-Aug-1996 Chief Complaint:   Routine Post Op  History of Present Illness:   Joy Hicks is a 25 y.o. G10P2002 Caucasian female 2wks s/p RCS being seen today for incision check.  Bottlefeeding, no dep/anx, planning COCs.  No LMP recorded.   Depression screen [facility] 2/9 02/13/2021 10/30/2020 07/05/2019 06/09/2017  Decreased Interest 0 0 0 0  Down, Depressed, Hopeless 0 0 0 0  PHQ - 2 Score 0 0 0 0  Altered sleeping 1 0 0 -  Tired, decreased energy 0 1 0 -  Change in appetite 0 0 0 -  Feeling bad or failure about yourself  0 0 0 -  Trouble concentrating 0 0 0 -  Moving slowly or fidgety/restless 0 0 0 -  Suicidal thoughts 0 0 0 -  PHQ-9 Score 1 1 0 -     GAD 7 : Generalized Anxiety Score 10/30/2020  Nervous, Anxious, on Edge 0  Control/stop worrying 0  Worry too much - different things 0  Trouble relaxing 0  Restless 0  Easily annoyed or irritable 0  Afraid - awful might happen 0  Total GAD 7 Score 0     Review of Systems:   Pertinent items are noted in HPI Denies fever/chills, dizziness, headaches, visual disturbances, fatigue, shortness of breath, chest pain, abdominal pain, vomiting, abnormal vaginal discharge/itching/odor/irritation, problems with periods, bowel movements, urination, or intercourse unless otherwise stated above.  Pertinent History Reviewed:  Reviewed past medical,surgical, social, obstetrical and family history.  Reviewed problem list, medications and allergies. Physical Assessment:   Vitals:   05/21/21 1440  BP: 114/69  Pulse: 80  Weight: 134 lb 12.8 oz (61.1 kg)  Height: 5\' 3"  (1.6 m)  Body mass index is 23.88 kg/m.       Physical Examination:   General appearance: alert, well appearing, and in no distress  Mental status: alert, oriented to person, place, and time  Skin: warm & dry   Cardiovascular: normal heart rate noted  Respiratory: normal respiratory effort, no  distress  Abdomen: soft, non-tender, c/s incision healing well, no s/s infection  Pelvic: examination not indicated  Extremities: no edema   Chaperone: N/A    No results found for this or any previous visit (from the past 24 hour(s)).  Assessment & Plan:  1) 2wks s/p RCS> incision healing well, bottlefeeding, abstinence until after pp visit  Meds: No orders of the defined types were placed in this encounter.   No orders of the defined types were placed in this encounter.   No follow-ups on file.  CNM, [facility] 05/21/2021 2:52 PM

## 2021-06-10 ENCOUNTER — Encounter: Payer: Self-pay | Admitting: Women's Health

## 2021-06-10 ENCOUNTER — Ambulatory Visit (INDEPENDENT_AMBULATORY_CARE_PROVIDER_SITE_OTHER): Payer: 59 | Admitting: Women's Health

## 2021-06-10 ENCOUNTER — Other Ambulatory Visit: Payer: Self-pay

## 2021-06-10 DIAGNOSIS — Z98891 History of uterine scar from previous surgery: Secondary | ICD-10-CM | POA: Diagnosis not present

## 2021-06-10 DIAGNOSIS — Z3202 Encounter for pregnancy test, result negative: Secondary | ICD-10-CM | POA: Diagnosis not present

## 2021-06-10 LAB — POCT URINE PREGNANCY: Preg Test, Ur: NEGATIVE

## 2021-06-10 MED ORDER — LO LOESTRIN FE 1 MG-10 MCG / 10 MCG PO TABS: 1.0000 | ORAL_TABLET | Freq: Every day | ORAL | 3 refills | Status: DC

## 2021-06-10 NOTE — Progress Notes (Signed)
POSTPARTUM VISIT Patient name: Joy Hicks MRN [id number]  Date of birth: Apr 09, 1996 Chief Complaint:   Postpartum Care (Wants to start birth control pills)  History of Present Illness:   Joy Hicks is a 25 y.o. G41P2002 Caucasian female being seen today for a postpartum visit. She is 5 weeks postpartum following a repeat cesarean section, low transverse incision at 39.0 gestational weeks. IOL: no, for n/a. Anesthesia: spinal.  Laceration: n/a.  Complications: none. Inpatient contraception: no.   Pregnancy uncomplicated. Tobacco use: no. Substance use disorder: no. Last pap smear: 02/10/19 and results were NILM w/ HRHPV not done. Next pap smear due: 2023 No LMP recorded.  Postpartum course has been uncomplicated. Bleeding scant staining. Bowel function is normal. Bladder function is normal. Urinary incontinence? no, fecal incontinence? no Patient is not sexually active. Last sexual activity: prior to birth of baby. Desired contraception: OCPs. Does not smoke, no h/o HTN, DVT/PE, CVA, MI, or migraines w/ aura. Patient does not want a pregnancy in the future.  Desired family size is 2 children.   Upstream - 06/10/21 1338       Pregnancy Intention Screening   Does the patient want to become pregnant in the next year? No    Does the patient's partner want to become pregnant in the next year? No    Would the patient like to discuss contraceptive options today? Yes      Contraception Wrap Up   Current Method Abstinence    End Method Oral Contraceptive            The pregnancy intention screening data noted above was reviewed. Potential methods of contraception were discussed. The patient elected to proceed with Oral Contraceptive.   Edinburgh Postpartum Depression Screening: negative  Edinburgh Postnatal Depression Scale - 06/10/21 1337       Edinburgh Postnatal Depression Scale:  In the Past 7 Days   I have been able to laugh and see the funny side of things. 0    I have  looked forward with enjoyment to things. 0    I have blamed myself unnecessarily when things went wrong. 1    I have been anxious or worried for no good reason. 1    I have felt scared or panicky for no good reason. 1    Things have been getting on top of me. 0    I have been so unhappy that I have had difficulty sleeping. 0    I have felt sad or miserable. 0    I have been so unhappy that I have been crying. 0    The thought of harming myself has occurred to me. 0    Edinburgh Postnatal Depression Scale Total 3             GAD 7 : Generalized Anxiety Score 10/30/2020  Nervous, Anxious, on Edge 0  Control/stop worrying 0  Worry too much - different things 0  Trouble relaxing 0  Restless 0  Easily annoyed or irritable 0  Afraid - awful might happen 0  Total GAD 7 Score 0     Baby's course has been uncomplicated. Baby is feeding by bottle. Infant has a pediatrician/family doctor? Yes.  Childcare strategy if returning to work/school: family.  Pt has material needs met for her and baby: Yes.   Review of Systems:   Pertinent items are noted in HPI Denies Abnormal vaginal discharge w/ itching/odor/irritation, headaches, visual changes, shortness of breath, chest pain, abdominal pain, severe  nausea/vomiting, or problems with urination or bowel movements. Pertinent History Reviewed:  Reviewed past medical,surgical, obstetrical and family history.  Reviewed problem list, medications and allergies. OB History  Gravida Para Term Preterm AB Living  2 2 2  0 0 2  SAB IAB Ectopic Multiple Live Births  0 0 0 0 2    # Outcome Date GA Lbr Len/2nd Weight Sex Delivery Anes PTL Lv  2 Term 05/06/21 [redacted]w[redacted]d 7 lb 6.7 oz (3.365 kg) M CS-LTranv Spinal  LIV  1 Term 11/29/19 325w2d5 lb 12.1 oz (2.61 kg) M CS-LTranv Spinal N LIV     Complications: Placenta previa, Gestational diabetes   Physical Assessment:   Vitals:   06/10/21 1340  BP: 108/68  Pulse: 76  Weight: 138 lb (62.6 kg)  Height: 5'  3" (1.6 m)  Body mass index is 24.45 kg/m.       Physical Examination:   General appearance: alert, well appearing, and in no distress  Mental status: alert, oriented to person, place, and time  Skin: warm & dry   Cardiovascular: normal heart rate noted   Respiratory: normal respiratory effort, no distress   Breasts: deferred, no complaints   Abdomen: soft, non-tender, c/s incision well healed  Pelvic: examination not indicated. Thin prep pap obtained: No  Rectal: not examined  Extremities: Edema: none   Chaperone: N/A         Results for orders placed or performed in visit on 06/10/21 (from the past 24 hour(s))  POCT urine pregnancy   Collection Time: 06/10/21  1:44 PM  Result Value Ref Range   Preg Test, Ur Negative Negative    Assessment & Plan:  1) Postpartum exam 2) 5 wks s/p repeat cesarean section, low transverse incision 3) bottle feeding 4) Depression screening 5) Contraception management: rx LoLoestrin, f/u 37m64m  Essential components of care per ACOG recommendations:  1.  Mood and well being:  If positive depression screen, discussed and plan developed.  If using tobacco we discussed reduction/cessation and risk of relapse If current substance abuse, we discussed and referral to local resources was offered.   2. Infant care and feeding:  If breastfeeding, discussed returning to work, pumping, breastfeeding-associated pain, guidance regarding return to fertility while lactating if not using another method. If needed, patient was provided with a letter to be allowed to pump q 2-3hrs to support lactation in a private location with access to a refrigerator to store breastmilk.   Recommended that all caregivers be immunized for flu, pertussis and other preventable communicable diseases If pt does not have material needs met for her/baby, referred to local resources for help obtaining these.  3. Sexuality, contraception and birth spacing Provided guidance regarding  sexuality, management of dyspareunia, and resumption of intercourse Discussed avoiding interpregnancy interval <6mt72mand recommended birth spacing of 18 months  4. Sleep and fatigue Discussed coping options for fatigue and sleep disruption Encouraged family/partner/community support of 4 hrs of uninterrupted sleep to help with mood and fatigue  5. Physical recovery  If pt had a C/S, assessed incisional pain and providing guidance on normal vs prolonged recovery If pt had a laceration, perineal healing and pain reviewed.  If urinary or fecal incontinence, discussed management and referred to PT or uro/gyn if indicated  Patient is safe to resume physical activity. Discussed attainment of healthy weight.  6.  Chronic disease management Discussed pregnancy complications if any, and their implications for future childbearing and long-term maternal health. Review recommendations for  prevention of recurrent pregnancy complications, such as 17 hydroxyprogesterone caproate to reduce risk for recurrent PTB not applicable, or aspirin to reduce risk of preeclampsia not applicable. Pt had GDM: no. If yes, 2hr GTT scheduled: not applicable. Reviewed medications and non-pregnant dosing including consideration of whether pt is breastfeeding using a reliable resource such as LactMed: not applicable Referred for f/u w/ PCP or subspecialist providers as indicated: not applicable  7. Health maintenance Mammogram at 25yo or earlier if indicated Pap smears as indicated  Meds:  Meds ordered this encounter  Medications   LO LOESTRIN FE 1 MG-10 MCG / 10 MCG tablet    Sig: Take 1 tablet by mouth daily.    Dispense:  90 tablet    Refill:  3    For co-pay card, pt to text "Lo Loestrin Fe " to [phone]              Co-pay card must be run in second position  "other coverage code 3"  if denied d/t PA, step edit, or insurance denial    Order Specific Question:   Supervising Provider    Answer:   Florian Buff  [[id number]]    Follow-up: Return in about 3 months (around 09/10/2021) for med f/u, CNM, in person.   Orders Placed This Encounter  Procedures   POCT urine pregnancy    [place], [facility] 06/10/2021 2:09 PM

## 2021-09-04 ENCOUNTER — Ambulatory Visit: Payer: 59 | Admitting: Women's Health

## 2022-02-07 ENCOUNTER — Other Ambulatory Visit: Payer: Self-pay | Admitting: Women's Health

## 2023-01-05 ENCOUNTER — Other Ambulatory Visit: Payer: Self-pay | Admitting: Women's Health

## 2023-04-12 ENCOUNTER — Other Ambulatory Visit: Payer: Self-pay | Admitting: Women's Health

## 2023-07-08 ENCOUNTER — Other Ambulatory Visit: Payer: Self-pay | Admitting: Women's Health

## 2023-10-03 ENCOUNTER — Other Ambulatory Visit: Payer: Self-pay | Admitting: Adult Health

## 2023-11-21 ENCOUNTER — Other Ambulatory Visit: Payer: Self-pay | Admitting: Adult Health

## 2024-02-02 ENCOUNTER — Other Ambulatory Visit: Payer: Self-pay | Admitting: Adult Health

## 2024-03-14 ENCOUNTER — Encounter: Payer: Self-pay | Admitting: Women's Health

## 2024-03-14 ENCOUNTER — Ambulatory Visit: Payer: Self-pay | Admitting: Women's Health

## 2024-03-14 ENCOUNTER — Other Ambulatory Visit (HOSPITAL_COMMUNITY)
Admission: RE | Admit: 2024-03-14 | Discharge: 2024-03-14 | Disposition: A | Discharge status: Home / Self Care | Source: Ambulatory Visit | Attending: Women's Health | Admitting: Women's Health

## 2024-03-14 VITALS — BP 113/75 | HR 66 | Ht 63.0 in | Wt 143.0 lb

## 2024-03-14 DIAGNOSIS — Z3041 Encounter for surveillance of contraceptive pills: Secondary | ICD-10-CM

## 2024-03-14 DIAGNOSIS — Z01419 Encounter for gynecological examination (general) (routine) without abnormal findings: Secondary | ICD-10-CM

## 2024-03-14 MED ORDER — LO LOESTRIN FE 1 MG-10 MCG / 10 MCG PO TABS: 1.0000 | ORAL_TABLET | Freq: Every day | ORAL | 3 refills | Status: AC

## 2024-03-14 NOTE — Progress Notes (Signed)
 WELL-WOMAN EXAMINATION Patient name: Joy Hicks MRN [id number]  Date of birth: [birth year] Chief Complaint:   Gynecologic Exam (Pap/physcial/ last pap 02-10-19 normal)  History of Present Illness:   Joy Hicks is a 28 y.o. G34P2002 Caucasian female being seen today for a routine well-woman exam.  Current complaints: needs refill on COCs  PCP: Celesta Gentile      does not desire labs Patient's last menstrual period was 02/20/2024. The current method of family planning is OCP (estrogen/progesterone).  Last pap 02/10/19. Results were: NILM w/ HRHPV not done. H/O abnormal pap: no Last mammogram: never. Results were: N/A. Family h/o breast cancer: no Last colonoscopy: never. Results were: N/A. Family h/o colorectal cancer: no     03/14/2024    8:42 AM 02/13/2021    8:53 AM 10/30/2020    9:29 AM 07/05/2019   11:22 AM 06/09/2017    9:18 AM  Depression screen PHQ 2/9  Decreased Interest 0 0 0 0 0  Down, Depressed, Hopeless 0 0 0 0 0  PHQ - 2 Score 0 0 0 0 0  Altered sleeping 0 1 0 0   Tired, decreased energy 0 0 1 0   Change in appetite 0 0 0 0   Feeling bad or failure about yourself  0 0 0 0   Trouble concentrating 0 0 0 0   Moving slowly or fidgety/restless 0 0 0 0   Suicidal thoughts  0 0 0   PHQ-9 Score 0 1 1 0         03/14/2024    8:42 AM 10/30/2020    9:29 AM  GAD 7 : Generalized Anxiety Score  Nervous, Anxious, on Edge 0 0  Control/stop worrying 0 0  Worry too much - different things 0 0  Trouble relaxing 0 0  Restless 0 0  Easily annoyed or irritable 0 0  Afraid - awful might happen 0 0  Total GAD 7 Score 0 0     Review of Systems:   Pertinent items are noted in HPI Denies any headaches, blurred vision, fatigue, shortness of breath, chest pain, abdominal pain, abnormal vaginal discharge/itching/odor/irritation, problems with periods, bowel movements, urination, or intercourse unless otherwise stated above. Pertinent History Reviewed:  Reviewed past  medical,surgical, social and family history.  Reviewed problem list, medications and allergies. Physical Assessment:   Vitals:   03/14/24 0834  BP: 113/75  Pulse: 66  Weight: 143 lb (64.9 kg)  Height: 5\' 3"  (1.6 m)  Body mass index is 25.33 kg/m.        Physical Examination:   General appearance - well appearing, and in no distress  Mental status - alert, oriented to person, place, and time  Psych:  She has a normal mood and affect  Skin - warm and dry, normal color, no suspicious lesions noted  Chest - effort normal, all lung fields clear to auscultation bilaterally  Heart - normal rate and regular rhythm  Neck:  midline trachea, no thyromegaly or nodules  Breasts - breasts appear normal, no suspicious masses, no skin or nipple changes or  axillary nodes  Abdomen - soft, nontender, nondistended, no masses or organomegaly  Pelvic - VULVA: normal appearing vulva with no masses, tenderness or lesions  VAGINA: normal appearing vagina with normal color and discharge, no lesions  CERVIX: normal appearing cervix without discharge or lesions, no CMT  Thin prep pap is done w/ HR HPV cotesting  UTERUS: uterus is felt to be normal size,  shape, consistency and nontender   ADNEXA: No adnexal masses or tenderness noted.  Extremities:  No swelling or varicosities noted  Chaperone: Peggy Dones  No results found for this or any previous visit (from the past 24 hours).  Assessment & Plan:  1) Well-Woman Exam  2) Contraception management> refilled COCs  Labs/procedures today: pap  Mammogram: @ 28yo, or sooner if problems Colonoscopy: @ 28yo, or sooner if problems  No orders of the defined types were placed in this encounter.   Meds:  Meds ordered this encounter  Medications   LO LOESTRIN FE 1 MG-10 MCG / 10 MCG tablet    Sig: Take 1 tablet by mouth daily.    Dispense:  90 tablet    Refill:  3    Follow-up: Return in about 1 year (around 03/14/2025) for Physical.  Cheral Marker CNM, WHNP-BC 03/14/2024 8:56 AM

## 2024-03-15 ENCOUNTER — Encounter: Payer: Self-pay | Admitting: Women's Health

## 2024-03-15 LAB — CYTOLOGY - PAP
Adequacy: ABSENT
Comment: NEGATIVE
Diagnosis: NEGATIVE
High risk HPV: NEGATIVE
